# Patient Record
Sex: Female | Born: 1945 | Race: White | Hispanic: No | Smoking: Former smoker
Health system: Southern US, Community
[De-identification: ages and names within clinical notes are randomized; demographics above are authoritative.]

## PROBLEM LIST (undated history)

## (undated) DIAGNOSIS — E785 Hyperlipidemia, unspecified: Secondary | ICD-10-CM

## (undated) DIAGNOSIS — I2510 Atherosclerotic heart disease of native coronary artery without angina pectoris: Secondary | ICD-10-CM

## (undated) DIAGNOSIS — E079 Disorder of thyroid, unspecified: Secondary | ICD-10-CM

## (undated) DIAGNOSIS — F0390 Unspecified dementia without behavioral disturbance: Secondary | ICD-10-CM

## (undated) DIAGNOSIS — F419 Anxiety disorder, unspecified: Secondary | ICD-10-CM

## (undated) DIAGNOSIS — R4189 Other symptoms and signs involving cognitive functions and awareness: Secondary | ICD-10-CM

## (undated) DIAGNOSIS — I10 Essential (primary) hypertension: Secondary | ICD-10-CM

---

## 2017-06-22 ENCOUNTER — Emergency Department
Admission: EM | Admit: 2017-06-22 | Discharge: 2017-06-22 | Disposition: A | Discharge status: Home / Self Care | Payer: Medicare Other | Attending: Student in an Organized Health Care Education/Training Program | Admitting: Student in an Organized Health Care Education/Training Program

## 2017-06-22 ENCOUNTER — Emergency Department: Payer: Medicare Other

## 2017-06-22 ENCOUNTER — Encounter: Payer: Self-pay | Admitting: Emergency Medicine

## 2017-06-22 DIAGNOSIS — I10 Essential (primary) hypertension: Secondary | ICD-10-CM | POA: Insufficient documentation

## 2017-06-22 DIAGNOSIS — F0390 Unspecified dementia without behavioral disturbance: Secondary | ICD-10-CM | POA: Insufficient documentation

## 2017-06-22 DIAGNOSIS — F419 Anxiety disorder, unspecified: Secondary | ICD-10-CM | POA: Insufficient documentation

## 2017-06-22 DIAGNOSIS — Z87891 Personal history of nicotine dependence: Secondary | ICD-10-CM | POA: Insufficient documentation

## 2017-06-22 DIAGNOSIS — K5900 Constipation, unspecified: Secondary | ICD-10-CM | POA: Insufficient documentation

## 2017-06-22 HISTORY — DX: Disorder of thyroid, unspecified: E07.9

## 2017-06-22 HISTORY — DX: Essential (primary) hypertension: I10

## 2017-06-22 HISTORY — DX: Unspecified dementia, unspecified severity, without behavioral disturbance, psychotic disturbance, mood disturbance, and anxiety: F03.90

## 2017-06-22 HISTORY — DX: Anxiety disorder, unspecified: F41.9

## 2017-06-22 MED ORDER — MAGNESIUM CITRATE PO SOLN
1.0000 | Freq: Once | ORAL | Status: AC
  Administered 2017-06-22: 1 via ORAL
  Filled 2017-06-22: qty 296

## 2017-06-22 MED ORDER — POLYETHYLENE GLYCOL 3350 17 G PO PACK
17.0000 g | PACK | Freq: Every day | ORAL | Status: DC
  Administered 2017-06-22: 17 g via ORAL
  Filled 2017-06-22: qty 1

## 2017-06-22 MED ORDER — GLYCERIN (LAXATIVE) 1.2 G RE SUPP
2.0000 | Freq: Once | RECTAL | Status: AC
  Administered 2017-06-22: 2.4 g via RECTAL
  Filled 2017-06-22: qty 2

## 2017-06-22 MED ORDER — PEG 3350 17 GM/SCOOP PO POWD: 17.0000 g | Freq: Every day | ORAL | 0 refills | Status: AC

## 2017-06-22 NOTE — Discharge Instructions (Signed)
Your exam and x-ray were negative for any signs of bowel obstruction or fecal impaction. You should take Miralax daily for stool softening. Consider other high fiber foods for daily fiber intake. Use the glycerin suppository as directed. You may drink half or all of the magnesium citrate in the morning. See your provider for continued symptoms. Return to the ED as needed. Follow-up with Dr. Clovis RileyMitchell for your repeat pelvic ultrasound as we discussed.

## 2017-06-22 NOTE — ED Provider Notes (Signed)
[facility] Emergency Department Provider Note ____________________________________________  Time seen: 1545  I have reviewed the triage vital signs and the nursing notes.  HISTORY  Chief Complaint  Constipation  HPI Julie Gaines is a 72 y.o. female presents to the ED accompanied by an adult friend, from her residence at the [facility].  The patient presents with a complaint of slow bowels and intermittent constipation since she had a hospitalization back in January.  The patient has some concerns about being impacted.  She describes a small amount of firm hard stool that she passed yesterday after eating prunes.  She describes occasional discomfort but she denies any significant pain only pressure.  She does take daily Colace at her assisted living facility.  She does admit that she has decreased intake of water because it is difficult for her to move swiftly to the restroom after excessive water intake.  She denies any rectal pain, bleeding, or hemorrhoids.  She also denies any nausea, vomiting, or abdominal pain.  Past Medical History:  Diagnosis Date  . Anxiety   . Dementia   . Hypertension   . Thyroid disease     There are no active problems to display for this patient.   History reviewed. No pertinent surgical history.  Prior to Admission medications   Medication Sig Start Date End Date Taking? Authorizing Provider  Polyethylene Glycol 3350 (PEG 3350) POWD Take 17 g by mouth daily. 06/22/17 07/22/17  Aislee Landgren, Julie IvoryJenise V Bacon, PA-C    Allergies Zoloft [sertraline hcl]  No family history on file.  Social History Social History   Tobacco Use  . Smoking status: Former Games developermoker  . Smokeless tobacco: Never Used  Substance Use Topics  . Alcohol use: No    Frequency: Never  . Drug use: Not on file    Review of Systems  Constitutional: Negative for fever. Cardiovascular: Negative for chest pain. Respiratory: Negative for shortness of  breath. Gastrointestinal: Negative for abdominal pain, vomiting and diarrhea. Constipation as noted above.  Genitourinary: Negative for dysuria. Musculoskeletal: Negative for back pain. Skin: Negative for rash. Neurological: Negative for headaches, focal weakness or numbness. ____________________________________________  PHYSICAL EXAM:  VITAL SIGNS: ED Triage Vitals  Enc Vitals Group     BP 06/22/17 1521 138/68     Pulse Rate 06/22/17 1521 77     Resp 06/22/17 1521 16     Temp 06/22/17 1521 97.8 F (36.6 C)     Temp Source 06/22/17 1521 Oral     SpO2 06/22/17 1521 97 %     Weight 06/22/17 1522 150 lb (68 kg)     Height 06/22/17 1522 5\' 6"  (1.676 m)     Head Circumference --      Peak Flow --      Pain Score 06/22/17 1521 6     Pain Loc --      Pain Edu? --      Excl. in GC? --     Constitutional: Alert and oriented. Well appearing and in no distress. Head: Normocephalic and atraumatic. Eyes: Conjunctivae are normal. Normal extraocular movements Cardiovascular: Normal rate, regular rhythm. Normal distal pulses. Respiratory: Normal respiratory effort. No wheezes/rales/rhonchi. Gastrointestinal: Soft and nontender. No distention, rebound, guarding, or rigidity. Normoactive bowel sounds x 4.  Musculoskeletal: Nontender with normal range of motion in all extremities.  Neurologic:  Normal gait without ataxia. Normal speech and language. No gross focal neurologic deficits are appreciated. Skin:  Skin is warm, dry and intact. No rash  noted. Psychiatric: Mood is anxand affect are normal. Patient exhibits appropriate insight and judgment. ____________________________________________   RADIOLOGY ABD 1 View  IMPRESSION: Negative. ____________________________________________  PROCEDURES  Procedures ____________________________________________  INITIAL IMPRESSION / ASSESSMENT AND PLAN / ED COURSE  Geriatric patient with ED evaluation of intermittent chronic constipation.   Patient presents with abdominal bloating and pressure.  She had concerns for impaction.  Her exam is overall benign showing soft abdomen with normoactive bowel sounds.  Her x-ray did not show any indication of small bowel obstruction, impaction, or large stool burden.  The patient and her adult friend are reassured by the overall exam and x-ray findings.  Patient has declined taking any of the medications ordered while in the ED to help effect stools.  She has been given the option to take medications when she returns home.  She will follow-up with her primary provider in 2 weeks as suggested.  She is advised to consider daily MiraLAX and increase her high-fiber foods.  Her symptoms likely represent a married of effects on her bowel emptying including her new environment in the assisted living facility.  Some concerns over food choices, some sensitivity over sharing a bathroom facility with a roommate.  Patient overall was pleasant and thankful for all attempts to help her.  She is pleasantly surprised that she is not in fact impacted.  She also is advised to follow-up with her primary provider for a routine outpatient pelvic ultrasound following a CT abdomen and pelvis from November that revealed some (benign) ovarian masses bilaterally.  Return precautions have been reviewed with the patient and her adult friend. ____________________________________________  FINAL CLINICAL IMPRESSION(S) / ED DIAGNOSES  Final diagnoses:  Constipation, unspecified constipation type      Lissa Hoard, PA-C 06/23/17 0008    Willy Eddy, MD 06/25/17 1930

## 2017-06-22 NOTE — ED Notes (Signed)
Pt given medications and she is planning to take them home and drink fluids and use suppositories because she does not want to have a bowel movement tonight here or have an emergency on the way home.

## 2017-06-22 NOTE — ED Triage Notes (Signed)
Patient presents to the ED with complaint of constipation since hospitalization since January.  Patient states, "I'm worried about being impacted."  Patient reports a small bowel movement yesterday after eating prunes.  Patient reports occasional discomfort but states, "It's not really pain, it's pressure."  Patient lives at [facility] assisted living and takes colace daily.

## 2017-09-14 ENCOUNTER — Encounter: Payer: Self-pay | Admitting: Emergency Medicine

## 2017-09-14 ENCOUNTER — Emergency Department: Payer: Medicare Other

## 2017-09-14 ENCOUNTER — Emergency Department
Admission: EM | Admit: 2017-09-14 | Discharge: 2017-09-14 | Disposition: A | Discharge status: Home / Self Care | Payer: Medicare Other | Source: Home / Self Care | Attending: Emergency Medicine | Admitting: Emergency Medicine

## 2017-09-14 ENCOUNTER — Other Ambulatory Visit: Payer: Self-pay

## 2017-09-14 ENCOUNTER — Emergency Department
Admission: EM | Admit: 2017-09-14 | Discharge: 2017-09-14 | Disposition: A | Discharge status: Home / Self Care | Payer: Medicare Other | Attending: Emergency Medicine | Admitting: Emergency Medicine

## 2017-09-14 DIAGNOSIS — I10 Essential (primary) hypertension: Secondary | ICD-10-CM | POA: Insufficient documentation

## 2017-09-14 DIAGNOSIS — Z87891 Personal history of nicotine dependence: Secondary | ICD-10-CM | POA: Insufficient documentation

## 2017-09-14 DIAGNOSIS — M7581 Other shoulder lesions, right shoulder: Secondary | ICD-10-CM | POA: Diagnosis not present

## 2017-09-14 DIAGNOSIS — F0390 Unspecified dementia without behavioral disturbance: Secondary | ICD-10-CM

## 2017-09-14 DIAGNOSIS — M25511 Pain in right shoulder: Secondary | ICD-10-CM | POA: Diagnosis present

## 2017-09-14 MED ORDER — DICLOFENAC SODIUM 1 % TD GEL: 2.0000 g | Freq: Four times a day (QID) | TRANSDERMAL | 1 refills | Status: AC

## 2017-09-14 NOTE — ED Provider Notes (Signed)
[facility] Emergency Department Provider Note ______________________________________   I have reviewed the triage vital signs and the nursing notes.   HISTORY  Chief Complaint Hypertension   History limited by: dementia, some history obtained from daughter   HPI Julie Gaines is a 72 y.o. female who presents to the emergency department today because of concern for blood pressure issues. Daughter states that for the past few weeks they have been having a hard time controlling the patient's blood pressure. They have been in contact with her primary care doctor who has been adjusting and adding medications. Patient was seen in the emergency department earlier today for shoulder injury. However after returning to her living facility she was noted to have high blood pressure. The patient was given her blood pressure medications and when it was checked 40 minutes later it was still high (systolic 170s, diastolic 120s). At this point nursing staff recommended ER evaluation. Patient denies any headache or chest pain.   Per medical record review patient has a history of dementia, ER visit earlier today.  Past Medical History:  Diagnosis Date  . Anxiety   . Dementia   . Hypertension   . Thyroid disease     There are no active problems to display for this patient.   History reviewed. No pertinent surgical history.  Prior to Admission medications   Medication Sig Start Date End Date Taking? Authorizing Provider  diclofenac sodium (VOLTAREN) 1 % GEL Apply 2 g topically 4 (four) times daily for 10 days. 09/14/17 09/24/17  Orvil FeilWoods, Jaclyn M, PA-C    Allergies Zoloft [sertraline hcl]  No family history on file.  Social History Social History   Tobacco Use  . Smoking status: Former Games developermoker  . Smokeless tobacco: Never Used  Substance Use Topics  . Alcohol use: No    Frequency: Never  . Drug use: Not on file    Review of Systems Constitutional: No  fever/chills Eyes: No visual changes. ENT: No sore throat. Cardiovascular: Denies chest pain. Respiratory: Denies shortness of breath. Gastrointestinal: No abdominal pain.  No nausea, no vomiting.  No diarrhea.   Genitourinary: Negative for dysuria. Musculoskeletal: Positive for right shoulder pain. Skin: Negative for rash. Neurological: Negative for headaches, focal weakness or numbness.  ____________________________________________   PHYSICAL EXAM:  VITAL SIGNS: ED Triage Vitals [09/14/17 2142]  Enc Vitals Group     BP (!) 155/67     Pulse Rate (!) 58     Resp 18     Temp 98.2 F (36.8 C)     Temp src      SpO2 100 %     Weight 149 lb (67.6 kg)     Height 5\' 6"  (1.676 m)     Head Circumference      Peak Flow      Pain Score 0   Constitutional: Awake and alert. No acute distress.  Eyes: Conjunctivae are normal.  ENT      Head: Normocephalic and atraumatic.      Nose: No congestion/rhinnorhea.      Mouth/Throat: Mucous membranes are moist.      Neck: No stridor. Hematological/Lymphatic/Immunilogical: No cervical lymphadenopathy. Cardiovascular: Normal rate, regular rhythm.  No murmurs, rubs, or gallops. Respiratory: Normal respiratory effort without tachypnea nor retractions. Breath sounds are clear and equal bilaterally. No wheezes/rales/rhonchi. Gastrointestinal: Soft and non tender. No rebound. No guarding.  Genitourinary: Deferred Musculoskeletal: Normal range of motion in all extremities. No lower extremity edema. Neurologic:  Normal speech and  language. No gross focal neurologic deficits are appreciated.  Skin:  Skin is warm, dry and intact. No rash noted. Psychiatric: Mood and affect are normal. Speech and behavior are normal. Patient exhibits appropriate insight and judgment.  ____________________________________________    LABS (pertinent positives/negatives)  None  ____________________________________________   EKG  I, Phineas Semen, attending  physician, personally viewed and interpreted this EKG  EKG Time: 2216 Rate: 52 Rhythm: sinus bradycardia Axis: normal Intervals: qtc 414 QRS: incomplete RBBB ST changes: no st elevation Impression: abnormal ekg  ____________________________________________    RADIOLOGY  None  ____________________________________________   PROCEDURES  Procedures  ____________________________________________   INITIAL IMPRESSION / ASSESSMENT AND PLAN / ED COURSE  Pertinent labs & imaging results that were available during my care of the patient were reviewed by me and considered in my medical decision making (see chart for details).   Patient presented to the emergency department today because of concerns for high blood pressure.  Otherwise asymptomatic.  She has been working with her primary care doctor.  I did discussion with patient and family.  At this point given lack of headache or chest pain do not feel any imaging or blood work is necessary.  Patient's family was concerned that she might be back in A. fib so EKG was performed.  This showed a sinus rhythm.  Did discuss continued follow-up with primary care doctor for further blood pressure control.   ____________________________________________   FINAL CLINICAL IMPRESSION(S) / ED DIAGNOSES  Final diagnoses:  Hypertension, unspecified type     Note: This dictation was prepared with Dragon dictation. Any transcriptional errors that result from this process are unintentional     Phineas Semen, MD 09/14/17 2218

## 2017-09-14 NOTE — Discharge Instructions (Signed)
Please seek medical attention for any high fevers, chest pain, shortness of breath, change in behavior, persistent vomiting, bloody stool or any other new or concerning symptoms.  

## 2017-09-14 NOTE — ED Triage Notes (Signed)
Patient ambulatory to triage with steady gait, without difficulty or distress noted, with aid of rolling walker; pt reports elevated BP; st seen earlier; st BP 175/120; pt denies c/o

## 2017-09-14 NOTE — ED Triage Notes (Signed)
Pt POA is with her, she reports that she is in an assisted living and two days ago she was getting physical therapy and they thought that she may have a dislocated right shoulder. POA reports that she has frequent falls. She also reports that her B/P has been fluctuating. Pt was able to push her walking wheelchair to the triage room without difficulty. No deformity or dislocations seen at this time.

## 2017-09-14 NOTE — ED Provider Notes (Signed)
[facility] Emergency Department Provider Note  ____________________________________________  Time seen: Approximately 6:32 PM  I have reviewed the triage vital signs and the nursing notes.   HISTORY  Chief Complaint Shoulder Pain    HPI Julie Gaines is a [age] y.o. female presents to the emergency department with 7 out of 10 right shoulder pain exacerbated by overhead activities.  Patient reports that she fell in January of this year and her PCP recommended physical therapy for her shoulder pain.  During physical therapy, patient has been doing overhead activities and patient reports worsening pain.  She denies chest pain or chest tightness.  No radiculopathy of the right arm.  No weakness or changes in sensation.  Patient relates that pain is starting along the deltoid and radiating into the proximal humerus.   Past Medical History:  Diagnosis Date  . Anxiety   . Dementia   . Hypertension   . Thyroid disease     There are no active problems to display for this patient.   History reviewed. No pertinent surgical history.  Prior to Admission medications   Medication Sig Start Date End Date Taking? Authorizing Provider  diclofenac sodium (VOLTAREN) 1 % GEL Apply 2 g topically 4 (four) times daily for 10 days. 09/14/17 09/24/17  Orvil FeilWoods, Karlea Mckibbin M, PA-C    Allergies Zoloft [sertraline hcl]  History reviewed. No pertinent family history.  Social History Social History   Tobacco Use  . Smoking status: Former Games developermoker  . Smokeless tobacco: Never Used  Substance Use Topics  . Alcohol use: No    Frequency: Never  . Drug use: Not on file     Review of Systems  Constitutional: No fever/chills Eyes: No visual changes. No discharge ENT: No upper respiratory complaints. Cardiovascular: no chest pain. Respiratory: no cough. No SOB. Gastrointestinal: No abdominal pain.  No nausea, no vomiting.  No diarrhea.  No constipation. Musculoskeletal: Patient has right  shoulder pain.  Skin: Negative for rash, abrasions, lacerations, ecchymosis. Neurological: Negative for headaches, focal weakness or numbness.  ____________________________________________   PHYSICAL EXAM:  VITAL SIGNS: ED Triage Vitals  Enc Vitals Group     BP 09/14/17 1527 (!) 155/79     Pulse Rate 09/14/17 1527 78     Resp 09/14/17 1527 18     Temp 09/14/17 1527 98.1 F (36.7 C)     Temp Source 09/14/17 1527 Oral     SpO2 09/14/17 1527 99 %     Weight 09/14/17 1528 149 lb (67.6 kg)     Height 09/14/17 1528 5\' 6"  (1.676 m)     Head Circumference --      Peak Flow --      Pain Score 09/14/17 1527 3     Pain Loc --      Pain Edu? --      Excl. in GC? --      Constitutional: Alert and oriented. Well appearing and in no acute distress. Eyes: Conjunctivae are normal. PERRL. EOMI. Head: Atraumatic. Cardiovascular: Normal rate, regular rhythm. Normal S1 and S2.  Good peripheral circulation. Respiratory: Normal respiratory effort without tachypnea or retractions. Lungs CTAB. Good air entry to the bases with no decreased or absent breath sounds. Gastrointestinal: Bowel sounds 4 quadrants. Soft and nontender to palpation. No guarding or rigidity. No palpable masses. No distention. No CVA tenderness. Musculoskeletal: Full range of motion to all extremities. No gross deformities appreciated.  No weakness with right rotator cuff testing.  Patient had positive Neer sign.  No tenderness elicited with palpation over the right AC joint. Neurologic:  Normal speech and language. No gross focal neurologic deficits are appreciated.  Skin:  Skin is warm, dry and intact. No rash noted.   ____________________________________________   LABS (all labs ordered are listed, but only abnormal results are displayed)  Labs Reviewed - No data to display ____________________________________________  EKG   ____________________________________________  RADIOLOGY Geraldo Pitter, personally  viewed and evaluated these images (plain radiographs) as part of my medical decision making, as well as reviewing the written report by the radiologist.  Dg Shoulder Right  Result Date: 09/14/2017 CLINICAL DATA:  72 year old female with right shoulder pain, possible shoulder dislocation 2 days ago. EXAM: RIGHT SHOULDER - 2+ VIEW COMPARISON:  None available. FINDINGS: No glenohumeral joint dislocation. The right clavicle, scapula, and proximal right humerus appear intact. It is unclear whether there may be right AC joint separation (image 2). Negative visible right ribs and lung parenchyma. IMPRESSION: Negative for fracture or right glenohumeral dislocation. If there is right AC joint region pain then consider bilateral clavicle/acromioclavicular views to exclude AC separation. Electronically Signed   By: Odessa Fleming M.D.   On: 09/14/2017 16:43    ____________________________________________    PROCEDURES  Procedure(s) performed:    Procedures    Medications - No data to display   ____________________________________________   INITIAL IMPRESSION / ASSESSMENT AND PLAN / ED COURSE  Pertinent labs & imaging results that were available during my care of the patient were reviewed by me and considered in my medical decision making (see chart for details).  Review of the [place] CSRS was performed in accordance of the [facility] prior to dispensing any controlled drugs.     Assessment and Plan:  Rotator cuff tendinitis Patient presents to the emergency department with right shoulder pain exacerbated by overhead activities.  History and physical exam findings were consistent with right rotator cuff tendinitis.  Patient was discharged with Voltaren gel as she cannot tolerate systemic anti-inflammatories and is resistant to systemic steroids.  Patient was referred to orthopedics.  All patient questions were answered.     ____________________________________________  FINAL CLINICAL IMPRESSION(S) / ED  DIAGNOSES  Final diagnoses:  Tendinitis of right rotator cuff      NEW MEDICATIONS STARTED DURING THIS VISIT:  ED Discharge Orders        Ordered    diclofenac sodium (VOLTAREN) 1 % GEL  4 times daily     09/14/17 1746          This chart was dictated using voice recognition software/Dragon. Despite best efforts to proofread, errors can occur which can change the meaning. Any change was purely unintentional.    Orvil Feil, PA-C 09/14/17 1840    Sharyn Creamer, MD 09/14/17 [phone]

## 2018-01-19 ENCOUNTER — Other Ambulatory Visit: Payer: Self-pay

## 2018-01-19 ENCOUNTER — Emergency Department
Admission: EM | Admit: 2018-01-19 | Discharge: 2018-01-21 | Disposition: A | Discharge status: Home / Self Care | Payer: Medicare Other | Attending: Emergency Medicine | Admitting: Emergency Medicine

## 2018-01-19 ENCOUNTER — Encounter: Payer: Self-pay | Admitting: Emergency Medicine

## 2018-01-19 ENCOUNTER — Emergency Department: Payer: Medicare Other

## 2018-01-19 DIAGNOSIS — Z87891 Personal history of nicotine dependence: Secondary | ICD-10-CM | POA: Insufficient documentation

## 2018-01-19 DIAGNOSIS — I10 Essential (primary) hypertension: Secondary | ICD-10-CM | POA: Insufficient documentation

## 2018-01-19 DIAGNOSIS — N3090 Cystitis, unspecified without hematuria: Secondary | ICD-10-CM | POA: Insufficient documentation

## 2018-01-19 DIAGNOSIS — F419 Anxiety disorder, unspecified: Secondary | ICD-10-CM | POA: Insufficient documentation

## 2018-01-19 DIAGNOSIS — F0390 Unspecified dementia without behavioral disturbance: Secondary | ICD-10-CM | POA: Insufficient documentation

## 2018-01-19 DIAGNOSIS — R413 Other amnesia: Secondary | ICD-10-CM

## 2018-01-19 DIAGNOSIS — R2681 Unsteadiness on feet: Secondary | ICD-10-CM | POA: Insufficient documentation

## 2018-01-19 DIAGNOSIS — N3000 Acute cystitis without hematuria: Secondary | ICD-10-CM

## 2018-01-19 DIAGNOSIS — R4182 Altered mental status, unspecified: Secondary | ICD-10-CM | POA: Diagnosis present

## 2018-01-19 LAB — URINALYSIS, COMPLETE (UACMP) WITH MICROSCOPIC
Bacteria, UA: NONE SEEN
Bilirubin Urine: NEGATIVE
GLUCOSE, UA: NEGATIVE mg/dL
HGB URINE DIPSTICK: NEGATIVE
Ketones, ur: NEGATIVE mg/dL
NITRITE: NEGATIVE
PH: 6 (ref 5.0–8.0)
Protein, ur: NEGATIVE mg/dL
SPECIFIC GRAVITY, URINE: 1.024 (ref 1.005–1.030)

## 2018-01-19 LAB — COMPREHENSIVE METABOLIC PANEL
ALT: 23 U/L (ref 0–44)
ANION GAP: 7 (ref 5–15)
AST: 25 U/L (ref 15–41)
Albumin: 3.7 g/dL (ref 3.5–5.0)
Alkaline Phosphatase: 120 U/L (ref 38–126)
BILIRUBIN TOTAL: 0.5 mg/dL (ref 0.3–1.2)
BUN: 29 mg/dL — AB (ref 8–23)
CO2: 25 mmol/L (ref 22–32)
Calcium: 8.9 mg/dL (ref 8.9–10.3)
Chloride: 110 mmol/L (ref 98–111)
Creatinine, Ser: 1.3 mg/dL — ABNORMAL HIGH (ref 0.44–1.00)
GFR calc Af Amer: 46 mL/min — ABNORMAL LOW (ref >60)
GFR, EST NON AFRICAN AMERICAN: 40 mL/min — AB (ref >60)
Glucose, Bld: 104 mg/dL — ABNORMAL HIGH (ref 70–99)
POTASSIUM: 4.1 mmol/L (ref 3.5–5.1)
Sodium: 142 mmol/L (ref 135–145)
TOTAL PROTEIN: 6.6 g/dL (ref 6.5–8.1)

## 2018-01-19 LAB — CBC
HCT: 38.7 % (ref 36.0–46.0)
HEMOGLOBIN: 12.9 g/dL (ref 12.0–15.0)
MCH: 31.4 pg (ref 26.0–34.0)
MCHC: 33.3 g/dL (ref 30.0–36.0)
MCV: 94.2 fL (ref 80.0–100.0)
Platelets: 224 10*3/uL (ref 150–400)
RBC: 4.11 MIL/uL (ref 3.87–5.11)
RDW: 12.9 % (ref 11.5–15.5)
WBC: 7.8 10*3/uL (ref 4.0–10.5)
nRBC: 0 % (ref 0.0–0.2)

## 2018-01-19 MED ORDER — CEPHALEXIN 500 MG PO CAPS
500.0000 mg | ORAL_CAPSULE | Freq: Once | ORAL | Status: AC
  Administered 2018-01-19: 500 mg via ORAL
  Filled 2018-01-19: qty 1

## 2018-01-19 MED ORDER — CEPHALEXIN 500 MG PO CAPS: 500.0000 mg | ORAL_CAPSULE | Freq: Four times a day (QID) | ORAL | 0 refills | Status: AC

## 2018-01-19 NOTE — Progress Notes (Signed)
   [facility]  Impression: encephalopthy superimposed on history of vascular dementia. Mentation is at baseline on exam, she is AAOx3. This is likely in setting of concurrent possible UTI. There is also a question of NPH on CT brain. This would not cause acute change in mentation but may be causing some of the long standing gait unsteadiness and dementia. Will need workup as an outpatient with MRI, will need large volume LP and neurosurgery evaluation.   Recommendations:  Antibiotics per ED attending  Neurosurgery evaluation for NPH Outpatient neurology fu as well  ---------------------------------------------------------------------  CC: worsening mentation and gait imbalance.   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 She has been unwell and difficulty with her feet and coordination. She states she has "fluid in her brain" and she has this sensation of losing control of her body. This has been going on 1-2 years where she feels progressively worse. Her POA came to visit her today and she was concerned she was not doing well. She uses a rolling walker at baseline and today she was more unsteady. She lives in a memory care unit and at the facility they noted her to be more forgetful over the past 3 days. Today she was shaky and clammy. She has a UTI and cultures pending. No new speech or visual changes. She does report incontinence as well.  She does not recall when her last stroke was, on asa only.   Diagnostic Testing: CT head: No acute intracranial abnormality.  Disproportionate ventricular enlargement to the degree of cortical atrophy. Extensive hypoattenuation of white matter could represent transependymal penetration of cerebrospinal fluid.  Correlate clinically for normal pressure hydrocephalus as a  cause of dementia.  Vital Signs:  reviewed  Exam:  Mental Status:  Awake, alert, oriented to self, place, date January 17, 2018 Naming: Intact Repetition: Intact   Speech: fluent  Cranial Nerves:  Pupils: Equal round and reactive to light Extraocular movements: Intact in all cardinal gaze Ptosis: Absent Visual fields: Intact to finger counting Facial sensation: Intact to pin and light touch Facial movements: Intact and symmetric    Motor Exam:  No drift   Tremor/Abnormal Movements:  Resting tremor: Absent Intention tremor: Absent Postural tremor: Absent  Sensory Exam:   Light touch: Intact Pinprick: Intact    Coordination:   Finger to nose: Intact Heel to shin: Intact  Medical Decision Making:  - Extensive number of diagnosis or management options are considered above.   - Extensive amount of complex data reviewed.   - High risk of complication and/or morbidity or mortality are associated with differential diagnostic considerations above.  - There may be uncertain outcome and increased probability of prolonged functional impairment or high probability of severe prolonged functional impairment associated with some of these differential diagnosis.   Medical Data Reviewed:  1.Data reviewed include clinical labs, radiology,  Medical Tests;   2.Tests results discussed w/performing or interpreting physician;   3.Obtaining/reviewing old medical records;  4.Obtaining case history from another source;  5.Independent review of image, tracing or specimen.    Patient was informed the Neurology Consult would happen via telehealth (remote video) and consented to receiving care in this manner.

## 2018-01-19 NOTE — ED Notes (Signed)
Pts daughter states pt has been having increased int. Confusion, Pt oriented to person place time situation. Pt resides in memory care assisted living. Pt assisted to toilet.

## 2018-01-19 NOTE — Discharge Instructions (Addendum)
Please make an appointment with your primary care physician to be reevaluated for your urinary tract infection, have your urine culture followed up, and discuss your anxiety and possibly restarting an SSRI.  Please take the entire course of antibiotics, even if you are feeling better.  Please make an appointment with a neurologist, to be evaluated for a condition called normal pressure hydrocephalus.  Return to the emergency department if you develop changes in mental status, falls, numbness tingling or weakness, visual or speech changes, fever or any other symptoms concerning to you.

## 2018-01-19 NOTE — ED Provider Notes (Signed)
[facility] Emergency Department Provider Note  ____________________________________________  Time seen: Approximately 5:35 PM  I have reviewed the triage vital signs and the nursing notes.   HISTORY  Chief Complaint Shoulder Pain and Altered Mental Status    HPI Julie Gaines is a 72 y.o. female a history of vascular dementia, HTN, anxiety, presenting for changes in mental status.  The patient is accompanied by her friend, who is her POA.  Per report, the patient has felt "off" for the last 3 days.  Her friend has talked to her on the phone, and she has asked questions about testing that she has had done in the past and already knows the results for.  She is also been feeling unsteady on her feet but has not had any falls.  She has had not had any changes in her medications and denies any illness including nausea vomiting or diarrhea, changes in appetite, dysuria.  She states she does not drink much water because "it goes right through me."  She reports ongoing right shoulder pain, which she has had chronically due to an [facility] joint dislocation without any new symptoms.  Patient also reports a long history of anxiety, for which she was on SSRIs in the past for approximately 10 to 15 years.  Recently, her anxiety has worsened and she feels particularly anxious in large crowds, which there often are in her facility.  Her PCP has recommended restarting SSRIs and she has been hesitant to do so.  Past Medical History:  Diagnosis Date  . Anxiety   . Dementia (HCC)   . Hypertension   . Thyroid disease     There are no active problems to display for this patient.   History reviewed. No pertinent surgical history.    Allergies Zoloft [sertraline hcl]  No family history on file.  Social History Social History   Tobacco Use  . Smoking status: Former Games developer  . Smokeless tobacco: Never Used  Substance Use Topics  . Alcohol use: No    Frequency: Never  . Drug use:  Not on file    Review of Systems Constitutional: No fever/chills.  No lightheadedness or syncope.  No trauma. Eyes: No visual changes.  No blurred or double vision. ENT: No sore throat. No congestion or rhinorrhea. Cardiovascular: Denies chest pain. Denies palpitations. Respiratory: Denies shortness of breath.  No cough. Gastrointestinal: No abdominal pain.  No nausea, no vomiting.  No diarrhea.  No constipation.  Decreased liquid p.o. intake. Genitourinary: Negative for dysuria.  Positive urinary frequency. Musculoskeletal: Negative for back pain.  Chronic right shoulder pain which is unchanged. Skin: Negative for rash. Neurological: Negative for headaches. No focal numbness, tingling or weakness.  Gait instability.  Changes in long-term memory. Psychiatric:Positive anxiety.  ____________________________________________   PHYSICAL EXAM:  VITAL SIGNS: ED Triage Vitals  Enc Vitals Group     BP 01/19/18 1601 (!) 168/100     Pulse Rate 01/19/18 1601 61     Resp 01/19/18 1601 16     Temp 01/19/18 1601 98.2 F (36.8 C)     Temp Source 01/19/18 1601 Oral     SpO2 01/19/18 1601 96 %     Weight 01/19/18 1602 154 lb (69.9 kg)     Height 01/19/18 1602 5\' 7"  (1.702 m)     Head Circumference --      Peak Flow --      Pain Score 01/19/18 1601 0     Pain Loc --  Pain Edu? --      Excl. in GC? --     Constitutional: Alert and answers most questions appropriately although sometimes makes abnormal connections that are nonsensical.  GCS is 15.   Eyes: Conjunctivae are normal.  EOMI. PERRLA.  No scleral icterus. Head: Atraumatic. Nose: No congestion/rhinnorhea. Mouth/Throat: Mucous membranes are moist.  Neck: No stridor.  Supple.  No JVD.  No meningismus. Cardiovascular: Normal rate, regular rhythm. No murmurs, rubs or gallops.  Respiratory: Normal respiratory effort.  No accessory muscle use or retractions. Lungs CTAB.  No wheezes, rales or ronchi. Gastrointestinal: Soft,  nontender and nondistended.  No guarding or rebound.  No peritoneal signs. Musculoskeletal: No LE edema.  Neurologic: Alert.  Speech is clear.  Face and smile are symmetric.  EOMI.  Moves all extremities well.  Gait is reassuring without ataxia. Skin:  Skin is warm, dry and intact. No rash noted. Psychiatric: The patient has a normal mood with a mildly anxious affect.  ____________________________________________   LABS (all labs ordered are listed, but only abnormal results are displayed)  Labs Reviewed  COMPREHENSIVE METABOLIC PANEL - Abnormal; Notable for the following components:      Result Value   Glucose, Bld 104 (*)    BUN 29 (*)    Creatinine, Ser 1.30 (*)    GFR calc non Af Amer 40 (*)    GFR calc Af Amer 46 (*)    All other components within normal limits  URINALYSIS, COMPLETE (UACMP) WITH MICROSCOPIC - Abnormal; Notable for the following components:   Color, Urine AMBER (*)    APPearance HAZY (*)    Leukocytes, UA TRACE (*)    All other components within normal limits  URINE CULTURE  CBC  CBG MONITORING, ED   ____________________________________________  EKG  ED ECG REPORT I, Anne-Caroline Sharma Covert, the attending physician, personally viewed and interpreted this ECG.   Date: 01/19/2018  EKG Time: 1741  Rate: 67  Rhythm: normal sinus rhythm  Axis: normal  Intervals:none  ST&T Change: no STEMI; unspecific T wave inversions in V1 and V2.  ____________________________________________  RADIOLOGY  Dg Shoulder Right  Result Date: 01/19/2018 CLINICAL DATA:  Pain after fall 9 months ago. Progressive pain over the last couple of weeks. EXAM: RIGHT SHOULDER - 2+ VIEW COMPARISON:  None. FINDINGS: There is no evidence of fracture or dislocation. There is no evidence of arthropathy or other focal bone abnormality. Soft tissues are unremarkable. IMPRESSION: Negative right shoulder radiographs. Electronically Signed   By: Marin Roberts M.D.   On: 01/19/2018 16:51    Ct Head Wo Contrast  Result Date: 01/19/2018 CLINICAL DATA:  Assisted living patient with dementia, altered mental status. EXAM: CT HEAD WITHOUT CONTRAST TECHNIQUE: Contiguous axial images were obtained from the base of the skull through the vertex without intravenous contrast. COMPARISON:  None. FINDINGS: Brain: No visible acute stroke, acute hemorrhage, mass lesion, or extra-axial fluid. Marked ventricular enlargement, disproportionate to the degree of cortical atrophy, suggesting normal pressure hydrocephalus. Extensive hypoattenuation of white matter could represent transependymal absorption of CSF. Vascular: Only minor atheromatous change at the carotid siphons. No signs of large vessel occlusion. Skull: Intact. Sinuses/Orbits: No acute findings. Other: None. IMPRESSION: No acute intracranial abnormality. Disproportionate ventricular enlargement to the degree of cortical atrophy. Extensive hypoattenuation of white matter could represent transependymal penetration of cerebrospinal fluid. Correlate clinically for normal pressure hydrocephalus as a cause of dementia. Electronically Signed   By: Elsie Stain M.D.   On: 01/19/2018 18:17  ____________________________________________   PROCEDURES  Procedure(s) performed: None  Procedures  Critical Care performed: No ____________________________________________   INITIAL IMPRESSION / ASSESSMENT AND PLAN / ED COURSE  Pertinent labs & imaging results that were available during my care of the patient were reviewed by me and considered in my medical decision making (see chart for details).  72 y.o. female with a history of vascular dementia, HTN, presenting with 3 days of being "off," including decreased ability with walking, and some memory impairment.  Overall, patient is hypertensive at 168/100 with otherwise normal vital signs; she is afebrile.  Clinically, she has some memory impairment but no other focal neurologic deficits.  We will  look for other causes of changes in mental status, including UTI.  Is also possible that she has worsening of her baseline vascular dementia.  Plan CT of the head for evaluation of stroke although I see no new focal neurologic deficit.  Plan reevaluation for final disposition.  ----------------------------------------- 8:14 PM on 01/19/2018 -----------------------------------------  The patient continues to be hemodynamically stable in the emergency department.  Her urinalysis shows white blood cells and leukocyte esterase without bacteria; a culture has been sent and the patient will be started on Keflex today.  Her CT scan does not show any acute stroke however there is a read stating that she has disproportionate ventricular enlargement to the degree of cortical atrophy concerning for possible normal pressure hydrocephalus.  Clearly, I would be concerned about this diagnosis given her memory difficulties and difficulty walking due to instability.  A tele-neurology consult has been placed.  ----------------------------------------- 8:59 PM on 01/19/2018 -----------------------------------------  The patient has been seen by neurologist who agrees that normal pressure hydrocephalus, while a possibility, requires outpatient evaluation.  I have given the patient the name of 2 local neurologists for follow-up.  I have talked to the patient about her urinary findings, and she understands that we will start her on Keflex and her primary care physician can follow-up the results of her culture.  In addition, I have encouraged her to talk to her primary care physician about her anxiety symptoms and possibly restarting an SSRI.  At this time, the patient is safe for discharge home.  Return precautions as well as follow-up instructions were discussed  ____________________________________________  FINAL CLINICAL IMPRESSION(S) / ED DIAGNOSES  Final diagnoses:  Unsteady gait  Memory problem  Acute cystitis  without hematuria  Anxiety         NEW MEDICATIONS STARTED DURING THIS VISIT:  New Prescriptions   CEPHALEXIN (KEFLEX) 500 MG CAPSULE    Take 1 capsule (500 mg total) by mouth 4 (four) times daily for 5 days.      Rockne Menghini, MD 01/19/18 2101

## 2018-01-19 NOTE — ED Triage Notes (Signed)
PT to ED with family from [facility]. PT hx of dementia but family states pt is not at baseline. PT states she is " overwhelmed because of all the people at the facility". PT also c/o RT shoulder pain x3days, facility does not know if possible fall. PT not on any anticoags. VSS . PT A&O x3

## 2018-01-21 LAB — URINE CULTURE: Culture: <10000 — AB

## 2018-05-01 ENCOUNTER — Encounter: Payer: Self-pay | Admitting: *Deleted

## 2018-05-01 ENCOUNTER — Inpatient Hospital Stay
Admission: EM | Admit: 2018-05-01 | Discharge: 2018-05-07 | DRG: 689 | Disposition: A | Discharge status: Home Health | Payer: Medicare Other | Attending: Specialist | Admitting: Specialist

## 2018-05-01 ENCOUNTER — Other Ambulatory Visit: Payer: Self-pay

## 2018-05-01 ENCOUNTER — Emergency Department: Payer: Medicare Other

## 2018-05-01 DIAGNOSIS — W1811XA Fall from or off toilet without subsequent striking against object, initial encounter: Secondary | ICD-10-CM | POA: Diagnosis present

## 2018-05-01 DIAGNOSIS — Z7982 Long term (current) use of aspirin: Secondary | ICD-10-CM | POA: Diagnosis not present

## 2018-05-01 DIAGNOSIS — I10 Essential (primary) hypertension: Secondary | ICD-10-CM | POA: Diagnosis present

## 2018-05-01 DIAGNOSIS — F0150 Vascular dementia without behavioral disturbance: Secondary | ICD-10-CM | POA: Diagnosis present

## 2018-05-01 DIAGNOSIS — E785 Hyperlipidemia, unspecified: Secondary | ICD-10-CM | POA: Diagnosis present

## 2018-05-01 DIAGNOSIS — Z87891 Personal history of nicotine dependence: Secondary | ICD-10-CM | POA: Diagnosis not present

## 2018-05-01 DIAGNOSIS — Y92091 Bathroom in other non-institutional residence as the place of occurrence of the external cause: Secondary | ICD-10-CM

## 2018-05-01 DIAGNOSIS — N390 Urinary tract infection, site not specified: Principal | ICD-10-CM | POA: Diagnosis present

## 2018-05-01 DIAGNOSIS — M79672 Pain in left foot: Secondary | ICD-10-CM | POA: Diagnosis present

## 2018-05-01 DIAGNOSIS — Z888 Allergy status to other drugs, medicaments and biological substances status: Secondary | ICD-10-CM

## 2018-05-01 DIAGNOSIS — I2510 Atherosclerotic heart disease of native coronary artery without angina pectoris: Secondary | ICD-10-CM | POA: Diagnosis present

## 2018-05-01 DIAGNOSIS — Z79890 Hormone replacement therapy (postmenopausal): Secondary | ICD-10-CM | POA: Diagnosis not present

## 2018-05-01 DIAGNOSIS — N3001 Acute cystitis with hematuria: Secondary | ICD-10-CM

## 2018-05-01 DIAGNOSIS — F419 Anxiety disorder, unspecified: Secondary | ICD-10-CM | POA: Diagnosis present

## 2018-05-01 DIAGNOSIS — G9341 Metabolic encephalopathy: Secondary | ICD-10-CM | POA: Diagnosis present

## 2018-05-01 DIAGNOSIS — B9620 Unspecified Escherichia coli [E. coli] as the cause of diseases classified elsewhere: Secondary | ICD-10-CM | POA: Diagnosis present

## 2018-05-01 DIAGNOSIS — Z79899 Other long term (current) drug therapy: Secondary | ICD-10-CM | POA: Diagnosis not present

## 2018-05-01 DIAGNOSIS — M1990 Unspecified osteoarthritis, unspecified site: Secondary | ICD-10-CM | POA: Diagnosis present

## 2018-05-01 DIAGNOSIS — R350 Frequency of micturition: Secondary | ICD-10-CM | POA: Diagnosis not present

## 2018-05-01 DIAGNOSIS — E039 Hypothyroidism, unspecified: Secondary | ICD-10-CM | POA: Diagnosis present

## 2018-05-01 DIAGNOSIS — R509 Fever, unspecified: Secondary | ICD-10-CM

## 2018-05-01 DIAGNOSIS — Z66 Do not resuscitate: Secondary | ICD-10-CM | POA: Diagnosis present

## 2018-05-01 HISTORY — DX: Hyperlipidemia, unspecified: E78.5

## 2018-05-01 HISTORY — DX: Atherosclerotic heart disease of native coronary artery without angina pectoris: I25.10

## 2018-05-01 HISTORY — DX: Other symptoms and signs involving cognitive functions and awareness: R41.89

## 2018-05-01 LAB — BASIC METABOLIC PANEL
Anion gap: 7 (ref 5–15)
BUN: 24 mg/dL — AB (ref 8–23)
CO2: 29 mmol/L (ref 22–32)
Calcium: 8.6 mg/dL — ABNORMAL LOW (ref 8.9–10.3)
Chloride: 101 mmol/L (ref 98–111)
Creatinine, Ser: 1.14 mg/dL — ABNORMAL HIGH (ref 0.44–1.00)
GFR calc Af Amer: 56 mL/min — ABNORMAL LOW (ref >60)
GFR calc non Af Amer: 48 mL/min — ABNORMAL LOW (ref >60)
Glucose, Bld: 134 mg/dL — ABNORMAL HIGH (ref 70–99)
Potassium: 3.4 mmol/L — ABNORMAL LOW (ref 3.5–5.1)
Sodium: 137 mmol/L (ref 135–145)

## 2018-05-01 LAB — URINALYSIS, COMPLETE (UACMP) WITH MICROSCOPIC
Bilirubin Urine: NEGATIVE
Glucose, UA: NEGATIVE mg/dL
Ketones, ur: NEGATIVE mg/dL
Nitrite: POSITIVE — AB
Protein, ur: 100 mg/dL — AB
Specific Gravity, Urine: 1.018 (ref 1.005–1.030)
WBC, UA: >50 WBC/hpf — ABNORMAL HIGH (ref 0–5)
pH: 6 (ref 5.0–8.0)

## 2018-05-01 LAB — CBC
HCT: 39.3 % (ref 36.0–46.0)
Hemoglobin: 13.1 g/dL (ref 12.0–15.0)
MCH: 31.5 pg (ref 26.0–34.0)
MCHC: 33.3 g/dL (ref 30.0–36.0)
MCV: 94.5 fL (ref 80.0–100.0)
Platelets: 167 10*3/uL (ref 150–400)
RBC: 4.16 MIL/uL (ref 3.87–5.11)
RDW: 12.7 % (ref 11.5–15.5)
WBC: 13.1 10*3/uL — ABNORMAL HIGH (ref 4.0–10.5)
nRBC: 0 % (ref 0.0–0.2)

## 2018-05-01 LAB — MRSA PCR SCREENING: MRSA by PCR: NEGATIVE

## 2018-05-01 MED ORDER — ONDANSETRON HCL 4 MG/2ML IJ SOLN: 4.0000 mg | Freq: Four times a day (QID) | INTRAMUSCULAR | Status: DC | PRN

## 2018-05-01 MED ORDER — DOCUSATE SODIUM 100 MG PO CAPS
100.0000 mg | ORAL_CAPSULE | Freq: Two times a day (BID) | ORAL | Status: DC | PRN
  Administered 2018-05-02: 100 mg via ORAL
  Filled 2018-05-01: qty 1

## 2018-05-01 MED ORDER — SODIUM CHLORIDE 0.9 % IV SOLN
INTRAVENOUS | Status: DC
  Administered 2018-05-01 – 2018-05-02 (×2): via INTRAVENOUS

## 2018-05-01 MED ORDER — ACETAMINOPHEN 650 MG RE SUPP: 650.0000 mg | Freq: Four times a day (QID) | RECTAL | Status: DC | PRN

## 2018-05-01 MED ORDER — SODIUM CHLORIDE 0.9 % IV SOLN
1.0000 g | INTRAVENOUS | Status: DC
  Administered 2018-05-02 – 2018-05-03 (×2): 1 g via INTRAVENOUS
  Filled 2018-05-01: qty 10
  Filled 2018-05-01: qty 1
  Filled 2018-05-01: qty 10
  Filled 2018-05-01: qty 1

## 2018-05-01 MED ORDER — LEVOTHYROXINE SODIUM 50 MCG PO TABS
50.0000 ug | ORAL_TABLET | Freq: Every day | ORAL | Status: DC
  Administered 2018-05-02 – 2018-05-07 (×5): 50 ug via ORAL
  Filled 2018-05-01 (×6): qty 1

## 2018-05-01 MED ORDER — ACYCLOVIR 200 MG PO CAPS
400.0000 mg | ORAL_CAPSULE | Freq: Two times a day (BID) | ORAL | Status: DC
  Administered 2018-05-01 – 2018-05-07 (×12): 400 mg via ORAL
  Filled 2018-05-01 (×13): qty 2

## 2018-05-01 MED ORDER — ENOXAPARIN SODIUM 40 MG/0.4ML ~~LOC~~ SOLN
40.0000 mg | SUBCUTANEOUS | Status: DC
  Filled 2018-05-01 (×4): qty 0.4

## 2018-05-01 MED ORDER — ONDANSETRON HCL 4 MG PO TABS: 4.0000 mg | ORAL_TABLET | Freq: Four times a day (QID) | ORAL | Status: DC | PRN

## 2018-05-01 MED ORDER — SODIUM CHLORIDE 0.9 % IV SOLN
1.0000 g | Freq: Once | INTRAVENOUS | Status: AC
  Administered 2018-05-01: 1 g via INTRAVENOUS

## 2018-05-01 MED ORDER — IBUPROFEN 400 MG PO TABS
200.0000 mg | ORAL_TABLET | Freq: Four times a day (QID) | ORAL | Status: DC | PRN
  Filled 2018-05-01: qty 1

## 2018-05-01 MED ORDER — ASPIRIN 81 MG PO CHEW
81.0000 mg | CHEWABLE_TABLET | Freq: Every day | ORAL | Status: DC
  Administered 2018-05-02 – 2018-05-07 (×6): 81 mg via ORAL
  Filled 2018-05-01 (×6): qty 1

## 2018-05-01 MED ORDER — TRAZODONE HCL 100 MG PO TABS
100.0000 mg | ORAL_TABLET | Freq: Every day | ORAL | Status: DC
  Administered 2018-05-01 – 2018-05-06 (×6): 100 mg via ORAL
  Filled 2018-05-01 (×6): qty 1

## 2018-05-01 MED ORDER — LISINOPRIL 10 MG PO TABS
10.0000 mg | ORAL_TABLET | Freq: Every day | ORAL | Status: DC
  Administered 2018-05-02 – 2018-05-07 (×6): 10 mg via ORAL
  Filled 2018-05-01 (×6): qty 1

## 2018-05-01 MED ORDER — ACETAMINOPHEN 325 MG PO TABS
650.0000 mg | ORAL_TABLET | Freq: Four times a day (QID) | ORAL | Status: DC | PRN
  Administered 2018-05-01 – 2018-05-07 (×9): 650 mg via ORAL
  Filled 2018-05-01 (×9): qty 2

## 2018-05-01 MED ORDER — LOPERAMIDE HCL 2 MG PO CAPS: 2.0000 mg | ORAL_CAPSULE | Freq: Four times a day (QID) | ORAL | Status: DC | PRN

## 2018-05-01 MED ORDER — METOPROLOL TARTRATE 50 MG PO TABS
50.0000 mg | ORAL_TABLET | Freq: Two times a day (BID) | ORAL | Status: DC
  Administered 2018-05-01 – 2018-05-07 (×12): 50 mg via ORAL
  Filled 2018-05-01 (×12): qty 1

## 2018-05-01 MED ORDER — ALUM & MAG HYDROXIDE-SIMETH 200-200-20 MG/5ML PO SUSP: 30.0000 mL | Freq: Once | ORAL | Status: DC | PRN

## 2018-05-01 MED ORDER — ACETAMINOPHEN 325 MG PO TABS
ORAL_TABLET | ORAL | Status: AC
  Filled 2018-05-01: qty 2

## 2018-05-01 MED ORDER — ALPRAZOLAM 0.5 MG PO TABS
0.5000 mg | ORAL_TABLET | Freq: Every day | ORAL | Status: DC
  Administered 2018-05-01 – 2018-05-07 (×7): 0.5 mg via ORAL
  Filled 2018-05-01 (×7): qty 1

## 2018-05-01 MED ORDER — BUSPIRONE HCL 15 MG PO TABS
7.5000 mg | ORAL_TABLET | Freq: Two times a day (BID) | ORAL | Status: DC
  Administered 2018-05-01 – 2018-05-06 (×11): 7.5 mg via ORAL
  Filled 2018-05-01 (×13): qty 1

## 2018-05-01 MED ORDER — ALPRAZOLAM 0.5 MG PO TABS
1.5000 mg | ORAL_TABLET | Freq: Every day | ORAL | Status: DC
  Administered 2018-05-01 – 2018-05-06 (×6): 1.5 mg via ORAL
  Filled 2018-05-01 (×6): qty 3

## 2018-05-01 MED ORDER — BISMUTH SUBSALICYLATE 262 MG/15ML PO SUSP
15.0000 mL | ORAL | Status: DC | PRN
  Filled 2018-05-01: qty 118

## 2018-05-01 MED ORDER — MAGNESIUM HYDROXIDE 400 MG/5ML PO SUSP
30.0000 mL | Freq: Once | ORAL | Status: DC | PRN
  Filled 2018-05-01: qty 30

## 2018-05-01 NOTE — Progress Notes (Signed)
Advanced care plan.  Purpose of the Encounter: CODE STATUS  Parties in Attendance: Patient herself and her healthcare power of attorney Corky Downs  Patient's Decision Capacity: Not intact but healthcare power of attorney is present  Subjective/Patient's story: Julie Gaines  is a 73 y.o. female with a known history of anxiety, coronary artery disease, cognitive dysfunction due to vascular dementia, hyperlipidemia, hypertension, hypothyroidism who is presenting to the emergency room with burning with urination and pain with urination   Objective/Medical story I discussed with the patient and her healthcare power regarding CODE STATUS   Goals of care determination:   Her healthcare power of attorney and patient states that she wants to be a DNR she has been DNR in the past  CODE STATUS:  DNR status  Time spent discussing advanced care planning: 16 minutes

## 2018-05-01 NOTE — ED Notes (Signed)
First Nurse Note: Patient lives at [facility] fell from commode this AM before 0800 this AM.

## 2018-05-01 NOTE — H&P (Signed)
[employer] - [place] [facility]   PATIENT NAME: Julie Gaines    MR#:  [id number]  DATE OF BIRTH:  [birth year]  DATE OF ADMISSION:  05/01/2018  PRIMARY CARE PHYSICIAN: Bernerd Limbo   REQUESTING/REFERRING PHYSICIAN: Daryel November, MD  CHIEF COMPLAINT:   Chief Complaint  Patient presents with  . Urinary Frequency    HISTORY OF PRESENT ILLNESS: Julie Gaines  is a 73 y.o. female with a known history of anxiety, coronary artery disease, cognitive dysfunction due to vascular dementia, hyperlipidemia, hypertension, hypothyroidism who is presenting to the emergency room with burning with urination and pain with urination.  Patient also has underlying dementia and has been confused and has been forgetful.  Which is worse than her normal.  Her healthcare power of attorney name Corky Downs who is her friend is at the bedside.      PAST MEDICAL HISTORY:   Past Medical History:  Diagnosis Date  . Anxiety   . CAD (coronary artery disease)   . Cognitive changes   . Dementia (HCC)   . Hyperlipemia   . Hypertension   . Thyroid disease     PAST SURGICAL HISTORY: History reviewed. No pertinent surgical history.  SOCIAL HISTORY:  Social History   Tobacco Use  . Smoking status: Former Games developer  . Smokeless tobacco: Never Used  Substance Use Topics  . Alcohol use: No    Frequency: Never    FAMILY HISTORY: No family history on file.  DRUG ALLERGIES:  Allergies  Allergen Reactions  . Zoloft [Sertraline Hcl] Other (See Comments)    Seizure    REVIEW OF SYSTEMS:   CONSTITUTIONAL:  Patient confused unable to provide review of systems    MEDICATIONS AT HOME:  Prior to Admission medications   Not on File  Medications reviewed please see medication    PHYSICAL EXAMINATION:   VITAL SIGNS: Blood pressure 125/78, pulse 72, temperature 99.7 F (37.6 C), temperature source Oral, resp. rate 16, height 5\' 6"  (1.676 m), weight 72.6 kg, SpO2 100 %.  GENERAL:   73 y.o.-year-old patient lying in the bed with no acute distress.  EYES: Pupils equal, round, reactive to light and accommodation. No scleral icterus. Extraocular muscles intact.  HEENT: Head atraumatic, normocephalic. Oropharynx and nasopharynx clear.  NECK:  Supple, no jugular venous distention. No thyroid enlargement, no tenderness.  LUNGS: Normal breath sounds bilaterally, no wheezing, rales,rhonchi or crepitation. No use of accessory muscles of respiration.  CARDIOVASCULAR: S1, S2 normal. No murmurs, rubs, or gallops.  ABDOMEN: Soft, nontender, nondistended. Bowel sounds present. No organomegaly or mass.  EXTREMITIES: No pedal edema, cyanosis, or clubbing.  NEUROLOGIC: Cranial nerves II through XII are intact. Muscle strength 5/5 in all extremities. Sensation intact. Gait not checked.  PSYCHIATRIC: The patient is alert not oriented to place or time SKIN: No obvious rash, lesion, or ulcer.   LABORATORY PANEL:   CBC Recent Labs  Lab 05/01/18 0940  WBC 13.1*  HGB 13.1  HCT 39.3  PLT 167  MCV 94.5  MCH 31.5  MCHC 33.3  RDW 12.7   ------------------------------------------------------------------------------------------------------------------  Chemistries  Recent Labs  Lab 05/01/18 0940  NA 137  K 3.4*  CL 101  CO2 29  GLUCOSE 134*  BUN 24*  CREATININE 1.14*  CALCIUM 8.6*   ------------------------------------------------------------------------------------------------------------------ estimated creatinine clearance is 45.5 mL/min (A) (by C-G formula based on SCr of 1.14 mg/dL (H)). ------------------------------------------------------------------------------------------------------------------ No results for input(s): TSH, T4TOTAL, T3FREE, THYROIDAB in the last 72 hours.  Invalid input(s):  FREET3   Coagulation profile No results for input(s): INR, PROTIME in the last 168  hours. ------------------------------------------------------------------------------------------------------------------- No results for input(s): DDIMER in the last 72 hours. -------------------------------------------------------------------------------------------------------------------  Cardiac Enzymes No results for input(s): CKMB, TROPONINI, MYOGLOBIN in the last 168 hours.  Invalid input(s): CK ------------------------------------------------------------------------------------------------------------------ Invalid input(s): POCBNP  ---------------------------------------------------------------------------------------------------------------  Urinalysis    Component Value Date/Time   COLORURINE YELLOW (A) 05/01/2018 0940   APPEARANCEUR CLOUDY (A) 05/01/2018 0940   LABSPEC 1.018 05/01/2018 0940   PHURINE 6.0 05/01/2018 0940   GLUCOSEU NEGATIVE 05/01/2018 0940   HGBUR LARGE (A) 05/01/2018 0940   BILIRUBINUR NEGATIVE 05/01/2018 0940   KETONESUR NEGATIVE 05/01/2018 0940   PROTEINUR 100 (A) 05/01/2018 0940   NITRITE POSITIVE (A) 05/01/2018 0940   LEUKOCYTESUR MODERATE (A) 05/01/2018 0940     RADIOLOGY: Ct Head Wo Contrast  Result Date: 05/01/2018 CLINICAL DATA:  Recent fall EXAM: CT HEAD WITHOUT CONTRAST TECHNIQUE: Contiguous axial images were obtained from the base of the skull through the vertex without intravenous contrast. COMPARISON:  01/19/2018 FINDINGS: Brain: Mild atrophy is identified and stable. The ventricles are again prominent similar to that seen on the prior exam. Diffuse decreased attenuation is noted in the deep white matter bilaterally stable from the prior exam. This may represent chronic white matter ischemic change or possible transependymal absorption of CSF. Correlate for any history of normal pressure hydrocephalus. No focal hemorrhage or acute infarct is seen. No mass lesion is noted. Vascular: No hyperdense vessel or unexpected calcification. Skull:  Normal. Negative for fracture or focal lesion. Sinuses/Orbits: No acute finding. Other: None. IMPRESSION: Stable chronic changes as described above similar to that seen on the prior exam. No acute abnormality noted. Electronically Signed   By: Alcide Clever M.D.   On: 05/01/2018 12:13    EKG: Orders placed or performed during the hospital encounter of 01/19/18  . ED EKG  . ED EKG  . EKG    IMPRESSION AND PLAN: Pt is 73 y/o with dementia presenting with confusion   1. Acute encephalopathy Due to UTI We will treat with IV ceftriaxone Follow urine cultures Due to generalized weakness will obtain PT evaluation  2.  Urinary tract infection we will treat with IV ceftriaxone follow culture  3.  Cognitive dysfunction continue Xanax BuSpar, trazodone  4.  Hypertension continue therapy with lisinopril and hypertension  5.  Hypothyroidism continue Synthroid  6.  Miscellaneous Lovenox for DVT prophylax    All the records are reviewed and case discussed with ED provider. Management plans discussed with the patient, family and they are in agreement.  CODE STATUS: Advance Directive Documentation     Most Recent Value  Type of Advance Directive  Healthcare Power of Attorney Beatrix Shipper is POA]  Pre-existing out of facility DNR order (yellow form or pink MOST form)  -  "MOST" Form in Place?  -       TOTAL TIME TAKING CARE OF THIS PATIENT: 55 minutes.    Auburn Bilberry M.D on 05/01/2018 at 1:59 PM  Between 7am to 6pm - Pager - [phone]  After 6pm go to [email or web address] - Social research officer, government  [employer] Office  [phone]  CC: Primary care physician; Bernerd Limbo

## 2018-05-01 NOTE — ED Triage Notes (Signed)
Pt reports a 3 day hx of intermittent urinary frequency.  Pt states that she thinks she "has a UTI but has not has the chance to get it treated".  Pt states that today she was feeling more unwell and she slid off the toilet and was in the floor and could not get up.  Denies any injuries.

## 2018-05-01 NOTE — ED Provider Notes (Signed)
[facility] Emergency Department Provider Note   First MD Initiated Contact with Patient 05/01/18 1339     (approximate)  I have reviewed the triage vital signs and the nursing notes.   HISTORY  Chief Complaint Urinary Frequency    HPI Julie Gaines is a 73 y.o. female with below list of chronic medical conditions including dementia residing in an assisted living facility presents to the emergency department with increasing urinary frequency dysuria and urgency over the past 3 days.  Patient denies any fever however noted to be febrile in the emergency department temperature 101.2. patient denies any back pain.  Patient denies any nausea vomiting or diarrhea.  Patient's power attorney at bedside states that the patient did have an episode where she fell this morning while attempting to get off the commode.  Patient denies any head injury no loss of consciousness.  Caregiver states that the patient went to the bathroom without a walker which is unusual for her.  She also states that she notes increasing confusion.   Past Medical History:  Diagnosis Date  . Anxiety   . CAD (coronary artery disease)   . Cognitive changes   . Dementia (HCC)   . Hyperlipemia   . Hypertension   . Thyroid disease     Patient Active Problem List   Diagnosis Date Noted  . UTI (urinary tract infection) 05/01/2018    History reviewed. No pertinent surgical history.  Prior to Admission medications   Medication Sig Start Date End Date Taking? Authorizing Provider  acyclovir (ZOVIRAX) 400 MG tablet Take 400 mg by mouth 2 (two) times daily.   Yes [provider]  ALPRAZolam (XANAX) 0.5 MG tablet Take 0.5 mg by mouth daily at 3 pm.   Yes [provider]  ALPRAZolam (XANAX) 1 MG tablet Take 1.5 mg by mouth at bedtime.   Yes [provider]  alum & mag hydroxide-simeth (MYLANTA) 200-200-20 MG/5ML suspension Take 30 mLs by mouth once as needed for indigestion  or heartburn.   Yes [provider]  aspirin 81 MG chewable tablet Chew 81 mg by mouth daily.   Yes [provider]  bismuth subsalicylate (PEPTO-BISMOL) 262 MG/15ML suspension Take 15 mLs by mouth every 2 (two) hours as needed (for up to six doses in 24 hours.).   Yes [provider]  busPIRone (BUSPAR) 7.5 MG tablet Take 7.5 mg by mouth 2 (two) times daily.   Yes [provider]  camphor-menthol Wynelle Fanny(SARNA) lotion Apply 1 application topically 2 (two) times daily. To back   Yes [provider]  docusate sodium (COLACE) 100 MG capsule Take 100 mg by mouth 2 (two) times daily as needed for mild constipation.   Yes [provider]  levothyroxine (SYNTHROID, LEVOTHROID) 50 MCG tablet Take 50 mcg by mouth daily before breakfast.   Yes [provider]  lisinopril (PRINIVIL,ZESTRIL) 10 MG tablet Take 10 mg by mouth daily.   Yes [provider]  loperamide (IMODIUM A-D) 2 MG tablet Take 2 mg by mouth 4 (four) times daily as needed for diarrhea or loose stools.   Yes [provider]  magnesium hydroxide (MILK OF MAGNESIA) 400 MG/5ML suspension Take 30 mLs by mouth once as needed for mild constipation.   Yes [provider]  metoprolol tartrate (LOPRESSOR) 50 MG tablet Take 50 mg by mouth 2 (two) times daily.   Yes [provider]  polyvinyl alcohol (LUBRICANT DROPS) 1.4 % ophthalmic solution Place 1 drop  into both eyes 4 (four) times daily as needed for dry eyes.   Yes [provider]  sodium phosphate (FLEET) 7-19 GM/118ML ENEM Place 1 enema rectally.   Yes [provider]  traZODone (DESYREL) 100 MG tablet Take 100 mg by mouth at bedtime.   Yes [provider]    Allergies Zoloft [sertraline hcl]  No family history on file.  Social History Social History   Tobacco Use  . Smoking status: Former Games developermoker  . Smokeless tobacco: Never Used  Substance Use Topics  . Alcohol use: No      Frequency: Never  . Drug use: Not on file    Review of Systems Constitutional: No fever/chills Eyes: No visual changes. ENT: No sore throat. Cardiovascular: Denies chest pain. Respiratory: Denies shortness of breath. Gastrointestinal: No abdominal pain.  No nausea, no vomiting.  No diarrhea.  No constipation. Genitourinary: Positive for dysuria frequency and urgency. Musculoskeletal: Negative for neck pain.  Negative for back pain. Integumentary: Negative for rash. Neurological: Negative for headaches, focal weakness or numbness.   ____________________________________________   PHYSICAL EXAM:  VITAL SIGNS: ED Triage Vitals  Enc Vitals Group     BP 05/01/18 0935 125/78     Pulse Rate 05/01/18 0935 72     Resp 05/01/18 0935 16     Temp 05/01/18 0935 99.7 F (37.6 C)     Temp Source 05/01/18 0935 Oral     SpO2 05/01/18 0935 100 %     Weight 05/01/18 0936 72.6 kg (160 lb)     Height 05/01/18 0936 1.676 m (5\' 6" )     Head Circumference --      Peak Flow --      Pain Score 05/01/18 0935 7     Pain Loc --      Pain Edu? --      Excl. in GC? --     Constitutional: Alert and oriented. Well appearing and in no acute distress. Eyes: Conjunctivae are normal. Mouth/Throat: Mucous membranes are moist. Oropharynx non-erythematous. Neck: No stridor.   Cardiovascular: Normal rate, regular rhythm. Good peripheral circulation. Grossly normal heart sounds. Respiratory: Normal respiratory effort.  No retractions. Lungs CTAB. Gastrointestinal: Soft and nontender. No distention.   Musculoskeletal: No lower extremity tenderness nor edema. No gross deformities of extremities. Neurologic:  Normal speech and language. No gross focal neurologic deficits are appreciated.  Skin:  Skin is warm, dry and intact. No rash noted. Psychiatric: Mood and affect are normal. Speech and behavior are normal.  ____________________________________________   LABS (all labs ordered are listed, but only  abnormal results are displayed)  Labs Reviewed  URINALYSIS, COMPLETE (UACMP) WITH MICROSCOPIC - Abnormal; Notable for the following components:      Result Value   Color, Urine YELLOW (*)    APPearance CLOUDY (*)    Hgb urine dipstick LARGE (*)    Protein, ur 100 (*)    Nitrite POSITIVE (*)    Leukocytes, UA MODERATE (*)    RBC / HPF >50 (*)    WBC, UA >50 (*)    Bacteria, UA RARE (*)    All other components within normal limits  BASIC METABOLIC PANEL - Abnormal; Notable for the following components:   Potassium 3.4 (*)    Glucose, Bld 134 (*)    BUN 24 (*)    Creatinine, Ser 1.14 (*)    Calcium 8.6 (*)    GFR calc non Af Amer 48 (*)    GFR calc Af  Amer 56 (*)    All other components within normal limits  CBC - Abnormal; Notable for the following components:   WBC 13.1 (*)    All other components within normal limits  URINE CULTURE   ______________________________________  RADIOLOGY I, [place] Jordann Grime, personally viewed and evaluated these images (plain radiographs) as part of my medical decision making, as well as reviewing the written report by the radiologist.  ED MD interpretation: No acute abnormality noted on CT scan of the head per radiologist  Official radiology report(s): Ct Head Wo Contrast  Result Date: 05/01/2018 CLINICAL DATA:  Recent fall EXAM: CT HEAD WITHOUT CONTRAST TECHNIQUE: Contiguous axial images were obtained from the base of the skull through the vertex without intravenous contrast. COMPARISON:  01/19/2018 FINDINGS: Brain: Mild atrophy is identified and stable. The ventricles are again prominent similar to that seen on the prior exam. Diffuse decreased attenuation is noted in the deep white matter bilaterally stable from the prior exam. This may represent chronic white matter ischemic change or possible transependymal absorption of CSF. Correlate for any history of normal pressure hydrocephalus. No focal hemorrhage or acute infarct is seen. No mass  lesion is noted. Vascular: No hyperdense vessel or unexpected calcification. Skull: Normal. Negative for fracture or focal lesion. Sinuses/Orbits: No acute finding. Other: None. IMPRESSION: Stable chronic changes as described above similar to that seen on the prior exam. No acute abnormality noted. Electronically Signed   By: Alcide Clever M.D.   On: 05/01/2018 12:13      Procedures   ____________________________________________   INITIAL IMPRESSION / ASSESSMENT AND PLAN / ED COURSE  As part of my medical decision making, I reviewed the following data within the electronic MEDICAL RECORD NUMBER 73 year old female present with above-stated history and physical exam concerning for urinary tract infection which is consistent with urinalysis findings.  Also considered a possibility of CVA given the patient's confusion and worsening problems with mobility.  CT scan of the head was performed which revealed no acute intracranial abnormality.  Given inability to ambulate increasing confusion in the setting of urinary tract infection patient discussed with Dr. Allena Katz for hospital admission further evaluation and management.  Patient given IV ceftriaxone 1 g in the emergency department ____________________________________________  FINAL CLINICAL IMPRESSION(S) / ED DIAGNOSES  Final diagnoses:  Acute cystitis with hematuria     MEDICATIONS GIVEN DURING THIS VISIT:  Medications  acetaminophen (TYLENOL) tablet 650 mg (650 mg Oral Given 05/01/18 1453)    Or  acetaminophen (TYLENOL) suppository 650 mg ( Rectal See Alternative 05/01/18 1453)  cefTRIAXone (ROCEPHIN) 1 g in sodium chloride 0.9 % 100 mL IVPB (has no administration in time range)  acetaminophen (TYLENOL) 325 MG tablet (has no administration in time range)  cefTRIAXone (ROCEPHIN) 1 g in sodium chloride 0.9 % 100 mL IVPB (0 g Intravenous Stopped 05/01/18 1339)     ED Discharge Orders    None       Note:  This document was prepared using  Dragon voice recognition software and may include unintentional dictation errors.    Darci Current, MD 05/01/18 [phone]

## 2018-05-01 NOTE — ED Notes (Signed)
Assisted to bedside commode. Generalized weakness. Usually walks at baseline per family but has increase in falls today.

## 2018-05-01 NOTE — ED Notes (Signed)
POA states that she is concerned because pt has hx of stroke and she states that when she was on the ground pt states that she could not get up as foot was "frozen in place"  Pt does not have focal weakness in triage. Urine visibly cloudy and amber. No speech change, no visual change, no arm drift.

## 2018-05-01 NOTE — ED Notes (Signed)
Patient transported to CT 

## 2018-05-01 NOTE — ED Notes (Signed)
ED TO INPATIENT HANDOFF REPORT  Name/Age/Gender Julie Gaines 73 y.o. female  Code Status Advance Directive Documentation     Most Recent Value  Type of Advance Directive  Healthcare Power of Attorney [name] is POA]  Pre-existing out of facility DNR order (yellow form or pink MOST form)  -  "MOST" Form in Place?  -      Home/SNF/Other Home  Chief Complaint  fell off toilet mult complaints  Level of Care/Admitting Diagnosis ED Disposition    ED Disposition Condition Comment   Admit  Hospital Area: [facility] [[id number]]  Level of Care: Med-Surg [16]  Diagnosis: UTI (urinary tract infection) [573220]  Admitting Physician: Auburn Bilberry [[id number]]  Attending Physician: Auburn Bilberry [[id number]]  Estimated length of stay: past midnight tomorrow  Certification:: I certify this patient will need inpatient services for at least 2 midnights  PT Class (Do Not Modify): Inpatient [101]  PT Acc Code (Do Not Modify): Private [1]       Medical History Past Medical History:  Diagnosis Date  . Anxiety   . CAD (coronary artery disease)   . Cognitive changes   . Dementia (HCC)   . Hyperlipemia   . Hypertension   . Thyroid disease     Allergies Allergies  Allergen Reactions  . Zoloft [Sertraline Hcl] Other (See Comments)    Seizure    IV Location/Drains/Wounds Patient Lines/Drains/Airways Status   Active Line/Drains/Airways    Name:   Placement date:   Placement time:   Site:   Days:   Peripheral IV 05/01/18 Left Forearm   05/01/18    1208    Forearm   less than 1          Labs/Imaging Results for orders placed or performed during the hospital encounter of 05/01/18 (from the past 48 hour(s))  Urinalysis, Complete w Microscopic     Status: Abnormal   Collection Time: 05/01/18  9:40 AM  Result Value Ref Range   Color, Urine YELLOW (A) YELLOW   APPearance CLOUDY (A) CLEAR   Specific Gravity, Urine 1.018 1.005 - 1.030   pH 6.0 5.0 - 8.0    Glucose, UA NEGATIVE NEGATIVE mg/dL   Hgb urine dipstick LARGE (A) NEGATIVE   Bilirubin Urine NEGATIVE NEGATIVE   Ketones, ur NEGATIVE NEGATIVE mg/dL   Protein, ur 831 (A) NEGATIVE mg/dL   Nitrite POSITIVE (A) NEGATIVE   Leukocytes, UA MODERATE (A) NEGATIVE   RBC / HPF >50 (H) 0 - 5 RBC/hpf   WBC, UA >50 (H) 0 - 5 WBC/hpf   Bacteria, UA RARE (A) NONE SEEN   Squamous Epithelial / LPF 0-5 0 - 5   WBC Clumps PRESENT    Mucus PRESENT     Comment: Performed at [facility], [address] [place], [place] [address]  Basic metabolic panel     Status: Abnormal   Collection Time: 05/01/18  9:40 AM  Result Value Ref Range   Sodium 137 135 - 145 mmol/L   Potassium 3.4 (L) 3.5 - 5.1 mmol/L   Chloride 101 98 - 111 mmol/L   CO2 29 22 - 32 mmol/L   Glucose, Bld 134 (H) 70 - 99 mg/dL   BUN 24 (H) 8 - 23 mg/dL   Creatinine, Ser 6.07 (H) 0.44 - 1.00 mg/dL   Calcium 8.6 (L) 8.9 - 10.3 mg/dL   GFR calc non Af Amer 48 (L) >60 mL/min   GFR calc Af Amer 56 (L) >60 mL/min  Anion gap 7 5 - 15    Comment: Performed at [facility], [address] [place], [place] [address]  CBC     Status: Abnormal   Collection Time: 05/01/18  9:40 AM  Result Value Ref Range   WBC 13.1 (H) 4.0 - 10.5 K/uL   RBC 4.16 3.87 - 5.11 MIL/uL   Hemoglobin 13.1 12.0 - 15.0 g/dL   HCT 69.639.3 29.536.0 - 28.446.0 %   MCV 94.5 80.0 - 100.0 fL   MCH 31.5 26.0 - 34.0 pg   MCHC 33.3 30.0 - 36.0 g/dL   RDW 13.212.7 44.011.5 - 10.215.5 %   Platelets 167 150 - 400 K/uL   nRBC 0.0 0.0 - 0.2 %    Comment: Performed at [facility], [address] [place], [place] [address]   Ct Head Wo Contrast  Result Date: 05/01/2018 CLINICAL DATA:  Recent fall EXAM: CT HEAD WITHOUT CONTRAST TECHNIQUE: Contiguous axial images were obtained from the base of the skull through the vertex without intravenous contrast. COMPARISON:  01/19/2018 FINDINGS: Brain: Mild atrophy is identified and stable. The ventricles are again prominent similar  to that seen on the prior exam. Diffuse decreased attenuation is noted in the deep white matter bilaterally stable from the prior exam. This may represent chronic white matter ischemic change or possible transependymal absorption of CSF. Correlate for any history of normal pressure hydrocephalus. No focal hemorrhage or acute infarct is seen. No mass lesion is noted. Vascular: No hyperdense vessel or unexpected calcification. Skull: Normal. Negative for fracture or focal lesion. Sinuses/Orbits: No acute finding. Other: None. IMPRESSION: Stable chronic changes as described above similar to that seen on the prior exam. No acute abnormality noted. Electronically Signed   By: Alcide CleverMark  Lukens M.D.   On: 05/01/2018 12:13    Pending Labs Unresulted Labs (From admission, onward)    Start     Ordered   05/01/18 1340  Urine culture  Add-on,   AD     05/01/18 1339   Signed and Held  CBC  (enoxaparin (LOVENOX)    CrCl >/= 30 ml/min)  Once,   R    Comments:  Baseline for enoxaparin therapy IF NOT ALREADY DRAWN.  Notify MD if PLT < 100 K.    Signed and Held   Signed and Held  Creatinine, serum  (enoxaparin (LOVENOX)    CrCl >/= 30 ml/min)  Once,   R    Comments:  Baseline for enoxaparin therapy IF NOT ALREADY DRAWN.    Signed and Held   Signed and Held  Creatinine, serum  (enoxaparin (LOVENOX)    CrCl >/= 30 ml/min)  Weekly,   R    Comments:  while on enoxaparin therapy    Signed and Held   Signed and Held  Urine culture  Once,   R     Signed and Held   Signed and Held  CBC  Tomorrow morning,   R     Signed and Held   Signed and Held  Basic metabolic panel  Tomorrow morning,   R     Signed and Held          Vitals/Pain Today's Vitals   05/01/18 0935 05/01/18 0936 05/01/18 1200 05/01/18 1449  BP: 125/78   (!) 160/76  Pulse: 72   80  Resp: 16   (!) 22  Temp: 99.7 F (37.6 C)   (!) 101.2 F (38.4 C)  TempSrc: Oral   Oral  SpO2: 100%  100%  Weight:  72.6 kg    Height:  5\' 6"  (1.676 m)     PainSc: 7   5  5      Isolation Precautions No active isolations  Medications Medications  acetaminophen (TYLENOL) tablet 650 mg (650 mg Oral Given 05/01/18 1453)    Or  acetaminophen (TYLENOL) suppository 650 mg ( Rectal See Alternative 05/01/18 1453)  cefTRIAXone (ROCEPHIN) 1 g in sodium chloride 0.9 % 100 mL IVPB (has no administration in time range)  acetaminophen (TYLENOL) 325 MG tablet (has no administration in time range)  cefTRIAXone (ROCEPHIN) 1 g in sodium chloride 0.9 % 100 mL IVPB (0 g Intravenous Stopped 05/01/18 1339)    Mobility walks with person assist

## 2018-05-02 ENCOUNTER — Inpatient Hospital Stay: Payer: Medicare Other

## 2018-05-02 LAB — BASIC METABOLIC PANEL
Anion gap: 5 (ref 5–15)
BUN: 25 mg/dL — ABNORMAL HIGH (ref 8–23)
CALCIUM: 7.9 mg/dL — AB (ref 8.9–10.3)
CO2: 27 mmol/L (ref 22–32)
Chloride: 104 mmol/L (ref 98–111)
Creatinine, Ser: 1.15 mg/dL — ABNORMAL HIGH (ref 0.44–1.00)
GFR calc Af Amer: 55 mL/min — ABNORMAL LOW (ref >60)
GFR calc non Af Amer: 47 mL/min — ABNORMAL LOW (ref >60)
Glucose, Bld: 111 mg/dL — ABNORMAL HIGH (ref 70–99)
Potassium: 3.6 mmol/L (ref 3.5–5.1)
Sodium: 136 mmol/L (ref 135–145)

## 2018-05-02 LAB — CBC
HCT: 34.9 % — ABNORMAL LOW (ref 36.0–46.0)
Hemoglobin: 11.6 g/dL — ABNORMAL LOW (ref 12.0–15.0)
MCH: 31.4 pg (ref 26.0–34.0)
MCHC: 33.2 g/dL (ref 30.0–36.0)
MCV: 94.6 fL (ref 80.0–100.0)
Platelets: 131 10*3/uL — ABNORMAL LOW (ref 150–400)
RBC: 3.69 MIL/uL — ABNORMAL LOW (ref 3.87–5.11)
RDW: 12.8 % (ref 11.5–15.5)
WBC: 14 10*3/uL — AB (ref 4.0–10.5)
nRBC: 0 % (ref 0.0–0.2)

## 2018-05-02 NOTE — Progress Notes (Signed)
[employer] [facility] [place] [facility]   PATIENT NAME: Julie Gaines    MR#:  [id number]  DATE OF BIRTH:  Aug 04, 1945  SUBJECTIVE:  POA is at bedside and reports that patient is wobbly   And very unsteady  REVIEW OF SYSTEMS:    With dementia pleasantly confused   Tolerating Diet: yes      DRUG ALLERGIES:   Allergies  Allergen Reactions  . Zoloft [Sertraline Hcl] Other (See Comments)    Seizure    VITALS:  Blood pressure 138/71, pulse 78, temperature 98.6 F (37 C), temperature source Oral, resp. rate 16, height 5\' 6"  (1.676 m), weight 72.6 kg, SpO2 97 %.  PHYSICAL EXAMINATION:  Constitutional: Appears well-developed and well-nourished. No distress. HENT: Normocephalic. Marland Kitchen Oropharynx is clear and moist.  Eyes: Conjunctivae and EOM are normal. PERRLA, no scleral icterus.  Neck: Normal ROM. Neck supple. No JVD. No tracheal deviation. CVS: RRR, S1/S2 +, no murmurs, no gallops, no carotid bruit.  Pulmonary: Effort and breath sounds normal, no stridor, rhonchi, wheezes, rales.  Abdominal: Soft. BS +,  no distension, tenderness, rebound or guarding.  Musculoskeletal: Normal range of motion. No edema and no tenderness.  Neuro: Alert. CN 2-12 grossly intact. No focal deficits. Skin: Skin is warm and dry. No rash noted. Psychiatric: Patient with dementia     LABORATORY PANEL:   CBC Recent Labs  Lab 05/02/18 0350  WBC 14.0*  HGB 11.6*  HCT 34.9*  PLT 131*   ------------------------------------------------------------------------------------------------------------------  Chemistries  Recent Labs  Lab 05/02/18 0350  NA 136  K 3.6  CL 104  CO2 27  GLUCOSE 111*  BUN 25*  CREATININE 1.15*  CALCIUM 7.9*   ------------------------------------------------------------------------------------------------------------------  Cardiac Enzymes No results for input(s): TROPONINI in the last 168  hours. ------------------------------------------------------------------------------------------------------------------  RADIOLOGY:  Ct Head Wo Contrast  Result Date: 05/01/2018 CLINICAL DATA:  Recent fall EXAM: CT HEAD WITHOUT CONTRAST TECHNIQUE: Contiguous axial images were obtained from the base of the skull through the vertex without intravenous contrast. COMPARISON:  01/19/2018 FINDINGS: Brain: Mild atrophy is identified and stable. The ventricles are again prominent similar to that seen on the prior exam. Diffuse decreased attenuation is noted in the deep white matter bilaterally stable from the prior exam. This may represent chronic white matter ischemic change or possible transependymal absorption of CSF. Correlate for any history of normal pressure hydrocephalus. No focal hemorrhage or acute infarct is seen. No mass lesion is noted. Vascular: No hyperdense vessel or unexpected calcification. Skull: Normal. Negative for fracture or focal lesion. Sinuses/Orbits: No acute finding. Other: None. IMPRESSION: Stable chronic changes as described above similar to that seen on the prior exam. No acute abnormality noted. Electronically Signed   By: Alcide Clever M.D.   On: 05/01/2018 12:13     ASSESSMENT AND PLAN:   73 year old female with a history of vascular dementia and hypothyroidism who presented to the ER due to urinary frequency and unsteady gait.  1.  Acute metabolic encephalopathy in the setting of urinary tract infection with underlying dementia. Will order MRI to evaluate for CVA as power of attorney is reporting patient is very wobbly gait  2.  UTI: Continue Rocephin and follow-up on final urine culture  3.  Dementia: Patient appears to be at baseline  4.  Hypothyroidism: Continue Synthroid  5.  Essential hypertension: Continue lisinopril and metoprolol  PT eval for d/c planning  Management plans discussed with the patient's POA and she is in agreement.  CODE STATUS:  DNR  TOTAL TIME TAKING CARE OF THIS PATIENT: 30 minutes.     POSSIBLE D/C tomorrow, DEPENDING ON CLINICAL CONDITION.   Isador Castille M.D on 05/02/2018 at 11:13 AM  Between 7am to 6pm - Pager - [phone] After 6pm go to [email or web address] - password [email or web address]  [facility]  Office  [phone]  CC: Primary care physician; Bernerd Limbo  Note: This dictation was prepared with Dragon dictation along with smaller phrase technology. Any transcriptional errors that result from this process are unintentional.

## 2018-05-02 NOTE — Evaluation (Signed)
Physical Therapy Evaluation Patient Details Name: Julie Gaines MRN: [id number] DOB: [birth year] Today's Date: 05/02/2018   History of Present Illness  Pt is a 73 y.o. female presenting to hospital 05/01/18 after sliding off of toilet; pt with burning/pain with urination and increased urinary frequency; pt also noted with increased confusion from baseline.  Pt admitted with acute encephalopathy d/t UTI.  PMH includes anxiety, CAD, cognitive dysfunction d/t vascular dementia, and htn.  Clinical Impression  Prior to hospital admission, pt reports being modified independent ambulating with rollator.  Pt lives at [facility].  Currently pt is independent with bed mobility and CGA with transfer bed to/from [facility] (see transfer section for details).  Attempted to have pt use walker but pt pushed walker out of way transferring to commode and then refused to use walker to transfer back to bed.  Pt preferring to have single UE support on select furniture during transfers and pt appeared steady with pt's method.  Attempted to encourage pt to ambulate during session multiple times but pt refusing d/t wanting to "sleep" and only wanted to use BSC to urinate.  Pt would benefit from skilled PT to address noted impairments and functional limitations (see below for any additional details) although limited session/assessment per pt's willingness to participate.  Upon hospital discharge, recommend pt discharge back to facility with HHPT and initial supervision for mobility/OOB for safety.    Follow Up Recommendations Home health PT;Supervision for mobility/OOB    Equipment Recommendations  Rolling walker with 5" wheels    Recommendations for Other Services       Precautions / Restrictions Precautions Precautions: Fall Restrictions Weight Bearing Restrictions: No      Mobility  Bed Mobility Overal bed mobility: Independent             General bed mobility comments: Supine to/from sit without any noted  difficulties; bed flat  Transfers Overall transfer level: Needs assistance Equipment used: (single UE support) Transfers: Sit to/from [employer] Sit to Stand: Min guard Stand pivot transfers: Min guard       General transfer comment: pt stood from bed with single UE support on bed rail and stood from [facility] with single UE support on armrest; pt given walker to use to transfer bed to [facility] but pt just pushed walker out of the way, took a couple steps, and then placed single UE support on BSC armrest prior to sitting down safely; pt declined use of walker to transfer back to bed and instead (after standing) leaned forward to place single UE support on mattress, took a couple steps, and then safely sat on bed  Ambulation/Gait             General Gait Details: Pt refused  Stairs            Wheelchair Mobility    Modified Rankin (Stroke Patients Only)       Balance Overall balance assessment: Needs assistance Sitting-balance support: No upper extremity supported Sitting balance-Leahy Scale: Normal Sitting balance - Comments: steady sitting reaching outside BOS   Standing balance support: No upper extremity supported Standing balance-Leahy Scale: Good Standing balance comment: steady standing managing briefs for toileting                             Pertinent Vitals/Pain Pain Assessment: 0-10 Pain Score: 5  Pain Location: general body aches from "Lyme" disease per pt report Pain Intervention(s): Limited activity within patient's  tolerance;Monitored during session;Repositioned;Other (comment)(RN notified)  Vitals (HR and O2 on room air) stable and WFL throughout treatment session.    Home Living Family/patient expects to be discharged to:: Assisted living Living Arrangements: Other (Comment)             Home Equipment: Walker - 4 wheels Additional Comments: Pt lives at [facility].    Prior Function Level of Independence: Needs  assistance   Gait / Transfers Assistance Needed: Pt reports ambulating in facility with rollator on her own; assist for meals           Hand Dominance        Extremity/Trunk Assessment   Upper Extremity Assessment Upper Extremity Assessment: Generalized weakness(pt declined to participate in assessment)    Lower Extremity Assessment Lower Extremity Assessment: Generalized weakness(pt declined to participate in assessment)    Cervical / Trunk Assessment Cervical / Trunk Assessment: Normal  Communication   Communication: No difficulties  Cognition Arousal/Alertness: Awake/alert Behavior During Therapy: WFL for tasks assessed/performed Overall Cognitive Status: No family/caregiver present to determine baseline cognitive functioning                                 General Comments: Oriented to person, place, and time; pt reports she does not know why she is at hospital      General Comments   Nursing cleared pt for participation in physical therapy.    Exercises     Assessment/Plan    PT Assessment Patient needs continued PT services  PT Problem List Decreased strength;Decreased activity tolerance;Decreased mobility;Decreased knowledge of precautions       PT Treatment Interventions DME instruction;Gait training;Functional mobility training;Therapeutic activities;Therapeutic exercise;Balance training;Patient/family education    PT Goals (Current goals can be found in the Care Plan section)  Acute Rehab PT Goals Patient Stated Goal: to get some rest PT Goal Formulation: With patient Time For Goal Achievement: 05/16/18 Potential to Achieve Goals: Good    Frequency Min 2X/week   Barriers to discharge        Co-evaluation               AM-PAC PT "6 Clicks" Mobility  Outcome Measure Help needed turning from your back to your side while in a flat bed without using bedrails?: None Help needed moving from lying on your back to sitting on the  side of a flat bed without using bedrails?: None Help needed moving to and from a bed to a chair (including a wheelchair)?: A Little Help needed standing up from a chair using your arms (e.g., wheelchair or bedside chair)?: A Little Help needed to walk in hospital room?: A Little Help needed climbing 3-5 steps with a railing? : A Lot 6 Click Score: 19    End of Session Equipment Utilized During Treatment: Gait belt Activity Tolerance: Patient tolerated treatment well Patient left: in bed;with call bell/phone within reach;with bed alarm set Nurse Communication: Mobility status;Precautions PT Visit Diagnosis: Other abnormalities of gait and mobility (R26.89);Muscle weakness (generalized) (M62.81);History of falling (Z91.81)    Time: 9798-9211 PT Time Calculation (min) (ACUTE ONLY): 23 min   Charges:   PT Evaluation $PT Eval Low Complexity: 1 Low         Kayliegh Boyers, PT 05/02/18, 3:46 PM [phone]

## 2018-05-03 ENCOUNTER — Inpatient Hospital Stay: Payer: Medicare Other

## 2018-05-03 MED ORDER — CIPROFLOXACIN HCL 500 MG PO TABS: 500.0000 mg | ORAL_TABLET | Freq: Two times a day (BID) | ORAL | 0 refills | Status: AC

## 2018-05-03 NOTE — Discharge Summary (Addendum)
[facility] [place] [facility]   PATIENT NAME: Julie Gaines    MR#:  [id number]  DATE OF BIRTH:  March 15, 1946  DATE OF ADMISSION:  05/01/2018 ADMITTING PHYSICIAN: Auburn Bilberry, MD  DATE OF DISCHARGE: 05/07/2018  PRIMARY CARE PHYSICIAN: Bernerd Limbo    ADMISSION DIAGNOSIS:  Acute cystitis with hematuria [N30.01]  DISCHARGE DIAGNOSIS:  Active Problems:   UTI (urinary tract infection)   SECONDARY DIAGNOSIS:   Past Medical History:  Diagnosis Date  . Anxiety   . CAD (coronary artery disease)   . Cognitive changes   . Dementia (HCC)   . Hyperlipemia   . Hypertension   . Thyroid disease     HOSPITAL COURSE:   73 year old female with a history of vascular dementia and hypothyroidism who presented to the ER due to urinary frequency and unsteady gait.  1.  Acute metabolic encephalopathy in the setting of urinary tract infection with underlying dementia. Patient is at baseline.  MRI negative for acute stroke.  2.  E Coli UTI: finished treatment for UTI with IV abx and also oral Cipro.   3.  Dementia: Patient appears to be at baseline  4.  Hypothyroidism: Continue Synthroid  5.  Essential hypertension: Continue lisinopril and metoprolol - BP stable.   6. Left foot pain - X-ray (-) this a.m. likely related to osteoarthritis.  Continue supportive care with pain control with Tylenol/Motrin.  Pt. Will be discharged home with Home Health.   DISCHARGE CONDITIONS AND DIET:   Stable for discharge on regular diet  CONSULTS OBTAINED:    DRUG ALLERGIES:   Allergies  Allergen Reactions  . Zoloft [Sertraline Hcl] Other (See Comments)    Seizure    DISCHARGE MEDICATIONS:   Allergies as of 05/03/2018      Reactions   Zoloft [sertraline Hcl] Other (See Comments)   Seizure      Medication List    TAKE these medications   acyclovir 400 MG tablet Commonly known as:  ZOVIRAX Take 400 mg by mouth 2 (two) times daily.   ALPRAZolam 0.5 MG  tablet Commonly known as:  XANAX Take 0.5 mg by mouth daily at 3 pm.   ALPRAZolam 1 MG tablet Commonly known as:  XANAX Take 1.5 mg by mouth at bedtime.   aspirin 81 MG chewable tablet Chew 81 mg by mouth daily.   busPIRone 7.5 MG tablet Commonly known as:  BUSPAR Take 7.5 mg by mouth 2 (two) times daily.   camphor-menthol lotion Commonly known as:  SARNA Apply 1 application topically 2 (two) times daily. To back   ciprofloxacin 500 MG tablet Commonly known as:  CIPRO Take 1 tablet (500 mg total) by mouth 2 (two) times daily for 2 days.   docusate sodium 100 MG capsule Commonly known as:  COLACE Take 100 mg by mouth 2 (two) times daily as needed for mild constipation.   levothyroxine 50 MCG tablet Commonly known as:  SYNTHROID, LEVOTHROID Take 50 mcg by mouth daily before breakfast.   lisinopril 10 MG tablet Commonly known as:  PRINIVIL,ZESTRIL Take 10 mg by mouth daily.   loperamide 2 MG tablet Commonly known as:  IMODIUM A-D Take 2 mg by mouth 4 (four) times daily as needed for diarrhea or loose stools.   LUBRICANT DROPS 1.4 % ophthalmic solution Generic drug:  polyvinyl alcohol Place 1 drop into both eyes 4 (four) times daily as needed for dry eyes.   metoprolol tartrate 50 MG tablet Commonly known as:  LOPRESSOR  Take 50 mg by mouth 2 (two) times daily.   MILK OF MAGNESIA 400 MG/5ML suspension Generic drug:  magnesium hydroxide Take 30 mLs by mouth once as needed for mild constipation.   MYLANTA 200-200-20 MG/5ML suspension Generic drug:  alum & mag hydroxide-simeth Take 30 mLs by mouth once as needed for indigestion or heartburn.   PEPTO-BISMOL 262 MG/15ML suspension Generic drug:  bismuth subsalicylate Take 15 mLs by mouth every 2 (two) hours as needed (for up to six doses in 24 hours.).   sodium phosphate 7-19 GM/118ML Enem Place 1 enema rectally.   traZODone 100 MG tablet Commonly known as:  DESYREL Take 100 mg by mouth at bedtime.          Today   CHIEF COMPLAINT:   No acute events overnight   VITAL SIGNS:  Blood pressure 126/88, pulse 68, temperature 98.7 F (37.1 C), temperature source Oral, resp. rate 20, height 5\' 6"  (1.676 m), weight 72.6 kg, SpO2 95 %.   REVIEW OF SYSTEMS:  Review of Systems  Unable to perform ROS: Dementia     PHYSICAL EXAMINATION:  GENERAL:  73 y.o.-year-old patient lying in the bed with no acute distress.  NECK:  Supple, no jugular venous distention. No thyroid enlargement, no tenderness.  LUNGS: Normal breath sounds bilaterally, no wheezing, rales,rhonchi  No use of accessory muscles of respiration.  CARDIOVASCULAR: S1, S2 normal. No murmurs, rubs, or gallops.  ABDOMEN: Soft, non-tender, non-distended. Bowel sounds present. No organomegaly or mass.  EXTREMITIES: No pedal edema, cyanosis, or clubbing.  PSYCHIATRIC: The patient is alert and oriented x name not place or time  SKIN: No obvious rash, lesion, or ulcer.   DATA REVIEW:   CBC Recent Labs  Lab 05/02/18 0350  WBC 14.0*  HGB 11.6*  HCT 34.9*  PLT 131*    Chemistries  Recent Labs  Lab 05/02/18 0350  NA 136  K 3.6  CL 104  CO2 27  GLUCOSE 111*  BUN 25*  CREATININE 1.15*  CALCIUM 7.9*    Cardiac Enzymes No results for input(s): TROPONINI in the last 168 hours.  Microbiology Results  @MICRORSLT48 @  RADIOLOGY:  Dg Chest 1 View  Result Date: 05/03/2018 CLINICAL DATA:  Fever. EXAM: CHEST  1 VIEW COMPARISON:  None. FINDINGS: The heart shadow appears mildly enlarged but size is poorly evaluated due to portable technique. There appears to be a tortuous thoracic aorta. The hila and mediastinum are otherwise unremarkable. No pneumothorax. No nodules or masses. No focal infiltrate to suggest pneumonia. IMPRESSION: No cause for fever identified. Electronically Signed   By: Gerome Samavid  Williams III M.D   On: 05/03/2018 08:36   Ct Head Wo Contrast  Result Date: 05/01/2018 CLINICAL DATA:  Recent fall EXAM: CT HEAD  WITHOUT CONTRAST TECHNIQUE: Contiguous axial images were obtained from the base of the skull through the vertex without intravenous contrast. COMPARISON:  01/19/2018 FINDINGS: Brain: Mild atrophy is identified and stable. The ventricles are again prominent similar to that seen on the prior exam. Diffuse decreased attenuation is noted in the deep white matter bilaterally stable from the prior exam. This may represent chronic white matter ischemic change or possible transependymal absorption of CSF. Correlate for any history of normal pressure hydrocephalus. No focal hemorrhage or acute infarct is seen. No mass lesion is noted. Vascular: No hyperdense vessel or unexpected calcification. Skull: Normal. Negative for fracture or focal lesion. Sinuses/Orbits: No acute finding. Other: None. IMPRESSION: Stable chronic changes as described above similar to that seen on  the prior exam. No acute abnormality noted. Electronically Signed   By: Alcide Clever M.D.   On: 05/01/2018 12:13   Mr Brain Wo Contrast  Result Date: 05/02/2018 CLINICAL DATA:  Stroke follow-up EXAM: MRI HEAD WITHOUT CONTRAST TECHNIQUE: Multiplanar, multiecho pulse sequences of the brain and surrounding structures were obtained without intravenous contrast. COMPARISON:  CT head 05/01/2018 FINDINGS: Brain: Negative for acute infarct. Mild atrophy. Mild ventricular enlargement is unchanged from prior studies. Extensive hyperintensity throughout the cerebral white matter bilaterally. Mild hyperintensity basal ganglia and pons bilaterally. Negative for hemorrhage or mass. No midline shift. Vascular: Normal arterial flow voids Skull and upper cervical spine: Negative Sinuses/Orbits: Mild mucosal edema paranasal sinuses. Left mastoid effusion. Normal orbit Other: None IMPRESSION: Atrophy and extensive chronic microvascular ischemia. No acute infarct. Electronically Signed   By: Marlan Palau M.D.   On: 05/02/2018 14:31      Allergies as of 05/03/2018       Reactions   Zoloft [sertraline Hcl] Other (See Comments)   Seizure      Medication List    TAKE these medications   acyclovir 400 MG tablet Commonly known as:  ZOVIRAX Take 400 mg by mouth 2 (two) times daily.   ALPRAZolam 0.5 MG tablet Commonly known as:  XANAX Take 0.5 mg by mouth daily at 3 pm.   ALPRAZolam 1 MG tablet Commonly known as:  XANAX Take 1.5 mg by mouth at bedtime.   aspirin 81 MG chewable tablet Chew 81 mg by mouth daily.   busPIRone 7.5 MG tablet Commonly known as:  BUSPAR Take 7.5 mg by mouth 2 (two) times daily.   camphor-menthol lotion Commonly known as:  SARNA Apply 1 application topically 2 (two) times daily. To back   ciprofloxacin 500 MG tablet Commonly known as:  CIPRO Take 1 tablet (500 mg total) by mouth 2 (two) times daily for 2 days.   docusate sodium 100 MG capsule Commonly known as:  COLACE Take 100 mg by mouth 2 (two) times daily as needed for mild constipation.   levothyroxine 50 MCG tablet Commonly known as:  SYNTHROID, LEVOTHROID Take 50 mcg by mouth daily before breakfast.   lisinopril 10 MG tablet Commonly known as:  PRINIVIL,ZESTRIL Take 10 mg by mouth daily.   loperamide 2 MG tablet Commonly known as:  IMODIUM A-D Take 2 mg by mouth 4 (four) times daily as needed for diarrhea or loose stools.   LUBRICANT DROPS 1.4 % ophthalmic solution Generic drug:  polyvinyl alcohol Place 1 drop into both eyes 4 (four) times daily as needed for dry eyes.   metoprolol tartrate 50 MG tablet Commonly known as:  LOPRESSOR Take 50 mg by mouth 2 (two) times daily.   MILK OF MAGNESIA 400 MG/5ML suspension Generic drug:  magnesium hydroxide Take 30 mLs by mouth once as needed for mild constipation.   MYLANTA 200-200-20 MG/5ML suspension Generic drug:  alum & mag hydroxide-simeth Take 30 mLs by mouth once as needed for indigestion or heartburn.   PEPTO-BISMOL 262 MG/15ML suspension Generic drug:  bismuth subsalicylate Take 15 mLs by  mouth every 2 (two) hours as needed (for up to six doses in 24 hours.).   sodium phosphate 7-19 GM/118ML Enem Place 1 enema rectally.   traZODone 100 MG tablet Commonly known as:  DESYREL Take 100 mg by mouth at bedtime.          Management plans discussed with the patient's POA and she is in agreement. Stable for discharge with [facility]  Patient should follow up with pcp  CODE STATUS:     Code Status Orders  (From admission, onward)         Start     Ordered   05/01/18 1537  Do not attempt resuscitation (DNR)  Continuous    Question Answer Comment  In the event of cardiac or respiratory ARREST Do not call a "code blue"   In the event of cardiac or respiratory ARREST Do not perform Intubation, CPR, defibrillation or ACLS   In the event of cardiac or respiratory ARREST Use medication by any route, position, wound care, and other measures to relive pain and suffering. May use oxygen, suction and manual treatment of airway obstruction as needed for comfort.      05/01/18 1536        Code Status History    This patient has a current code status but no historical code status.    Advance Directive Documentation     Most Recent Value  Type of Advance Directive  Healthcare Power of Attorney  Pre-existing out of facility DNR order (yellow form or pink MOST form)  -  "MOST" Form in Place?  -      TOTAL TIME TAKING CARE OF THIS PATIENT: 38 minutes.    Note: This dictation was prepared with Dragon dictation along with smaller phrase technology. Any transcriptional errors that result from this process are unintentional.  MODY, SITAL M.D on 05/03/2018 at 10:30 AM  Between 7am to 6pm - Pager - [phone]  After 6pm go to [email or web address] - Social research officer, governmentpassword [email or web address] [email or web address]  [facility]  Office  [phone]  CC: Primary care physician; Bernerd LimboMitchell, John

## 2018-05-03 NOTE — Care Management (Addendum)
Per HPOA Beatrix Shipper she has filed and appeal against discharge.  She has contacted [facility] and detailed notice of discharge signed.  Olegario Messier Allmond CMA notified.  MD notified.   PT has assessed patient and recommended home health.  When CSW spoke with POA she was agreeable to home health services, and did not have a preference of agency

## 2018-05-03 NOTE — Plan of Care (Signed)
Text to inform Dr. Barbaraann Faster POA still concerned about patient's continued "weakness" and is requesting possible neurology consult.

## 2018-05-03 NOTE — NC FL2 (Addendum)
[place] MEDICAID FL2 LEVEL OF CARE SCREENING TOOL     IDENTIFICATION  Patient Name: Julie Gaines Birthdate: [birth year] Sex: female Admission Date (Current Location): 05/01/2018  [place]   Facility and Address:  [facility], [address], [place], [place] [address]      Provider Number: [id number]  Attending Physician Name and Address:  Adrian SaranMody, Sital, MD  Relative Name and Phone Number:       Current Level of Care: Hospital Recommended Level of Care: Assisted Living Facility: Memory Care  Prior Approval Number:    Date Approved/Denied:   PASRR Number:    Discharge Plan: (ALF) Memory Care    Current Diagnoses: Vascular Dementia without behavioral disturbances. Patient Active Problem List   Diagnosis Date Noted  . UTI (urinary tract infection) 05/01/2018    Orientation RESPIRATION BLADDER Height & Weight     Self, Place, Situation  Normal Continent Weight: 160 lb (72.6 kg) Height:  5\' 6"  (167.6 cm)  BEHAVIORAL SYMPTOMS/MOOD NEUROLOGICAL BOWEL NUTRITION STATUS  (none) (none) Continent Diet(heart healthy)  AMBULATORY STATUS COMMUNICATION OF NEEDS Skin   Limited Assist Verbally Normal                       Personal Care Assistance Level of Assistance  Bathing, Dressing Bathing Assistance: Limited assistance   Dressing Assistance: Limited assistance     Functional Limitations Info  (none noted)          SPECIAL CARE FACTORS FREQUENCY  PT (By licensed PT)(home health PT)                    Contractures Contractures Info: Not present    Additional Factors Info  Code Status               Allergies as of 05/03/2018      Reactions   Zoloft [sertraline Hcl] Other (See Comments)   Seizure         Medication List    TAKE these medications   acyclovir 400 MG tablet Commonly known as:  ZOVIRAX Take 400 mg by mouth 2 (two) times daily.   ALPRAZolam 0.5 MG tablet Commonly  known as:  XANAX Take 0.5 mg by mouth daily at 3 pm.   ALPRAZolam 1 MG tablet Commonly known as:  XANAX Take 1.5 mg by mouth at bedtime.   aspirin 81 MG chewable tablet Chew 81 mg by mouth daily.   busPIRone 7.5 MG tablet Commonly known as:  BUSPAR Take 7.5 mg by mouth 2 (two) times daily.   camphor-menthol lotion Commonly known as:  SARNA Apply 1 application topically 2 (two) times daily. To back   ciprofloxacin 500 MG tablet Commonly known as:  CIPRO Take 1 tablet (500 mg total) by mouth 2 (two) times daily for 2 days.   docusate sodium 100 MG capsule Commonly known as:  COLACE Take 100 mg by mouth 2 (two) times daily as needed for mild constipation.   levothyroxine 50 MCG tablet Commonly known as:  SYNTHROID, LEVOTHROID Take 50 mcg by mouth daily before breakfast.   lisinopril 10 MG tablet Commonly known as:  PRINIVIL,ZESTRIL Take 10 mg by mouth daily.   loperamide 2 MG tablet Commonly known as:  IMODIUM A-D Take 2 mg by mouth 4 (four) times daily as needed for diarrhea or loose stools.   LUBRICANT DROPS 1.4 % ophthalmic solution Generic drug:  polyvinyl alcohol Place 1 drop into both  eyes 4 (four) times daily as needed for dry eyes.   metoprolol tartrate 50 MG tablet Commonly known as:  LOPRESSOR Take 50 mg by mouth 2 (two) times daily.   MILK OF MAGNESIA 400 MG/5ML suspension Generic drug:  magnesium hydroxide Take 30 mLs by mouth once as needed for mild constipation.   MYLANTA 200-200-20 MG/5ML suspension Generic drug:  alum & mag hydroxide-simeth Take 30 mLs by mouth once as needed for indigestion or heartburn.   PEPTO-BISMOL 262 MG/15ML suspension Generic drug:  bismuth subsalicylate Take 15 mLs by mouth every 2 (two) hours as needed (for up to six doses in 24 hours.).   sodium phosphate 7-19 GM/118ML Enem Place 1 enema rectally.   traZODone 100 MG tablet Commonly known as:  DESYREL Take 100 mg by mouth at bedtime.         Additional Information    York Spaniel, LCSW

## 2018-05-03 NOTE — Discharge Instructions (Signed)
Urinary Tract Infection, Adult A urinary tract infection (UTI) is an infection of any part of the urinary tract. The urinary tract includes:  The kidneys.  The ureters.  The bladder.  The urethra. These organs make, store, and get rid of pee (urine) in the body. What are the causes? This is caused by germs (bacteria) in your genital area. These germs grow and cause swelling (inflammation) of your urinary tract. What increases the risk? You are more likely to develop this condition if:  You have a small, thin tube (catheter) to drain pee.  You cannot control when you pee or poop (incontinence).  You are female, and: ? You use these methods to prevent pregnancy: ? A medicine that kills sperm (spermicide). ? A device that blocks sperm (diaphragm). ? You have low levels of a female hormone (estrogen). ? You are pregnant.  You have genes that add to your risk.  You are sexually active.  You take antibiotic medicines.  You have trouble peeing because of: ? A prostate that is bigger than normal, if you are female. ? A blockage in the part of your body that drains pee from the bladder (urethra). ? A kidney stone. ? A nerve condition that affects your bladder (neurogenic bladder). ? Not getting enough to drink. ? Not peeing often enough.  You have other conditions, such as: ? Diabetes. ? A weak disease-fighting system (immune system). ? Sickle cell disease. ? Gout. ? Injury of the spine. What are the signs or symptoms? Symptoms of this condition include:  Needing to pee right away (urgently).  Peeing often.  Peeing small amounts often.  Pain or burning when peeing.  Blood in the pee.  Pee that smells bad or not like normal.  Trouble peeing.  Pee that is cloudy.  Fluid coming from the vagina, if you are female.  Pain in the belly or lower back. Other symptoms include:  Throwing up (vomiting).  No urge to eat.  Feeling mixed up (confused).  Being tired  and grouchy (irritable).  A fever.  Watery poop (diarrhea). How is this treated? This condition may be treated with:  Antibiotic medicine.  Other medicines.  Drinking enough water. Follow these instructions at home:  Medicines  Take over-the-counter and prescription medicines only as told by your doctor.  If you were prescribed an antibiotic medicine, take it as told by your doctor. Do not stop taking it even if you start to feel better. General instructions  Make sure you: ? Pee until your bladder is empty. ? Do not hold pee for a long time. ? Empty your bladder after sex. ? Wipe from front to back after pooping if you are a female. Use each tissue one time when you wipe.  Drink enough fluid to keep your pee pale yellow.  Keep all follow-up visits as told by your doctor. This is important. Contact a doctor if:  You do not get better after 1-2 days.  Your symptoms go away and then come back. Get help right away if:  You have very bad back pain.  You have very bad pain in your lower belly.  You have a fever.  You are sick to your stomach (nauseous).  You are throwing up. Summary  A urinary tract infection (UTI) is an infection of any part of the urinary tract.  This condition is caused by germs in your genital area.  There are many risk factors for a UTI. These include having a small, thin   tube to drain pee and not being able to control when you pee or poop.  Treatment includes antibiotic medicines for germs.  Drink enough fluid to keep your pee pale yellow. This information is not intended to replace advice given to you by your health care provider. Make sure you discuss any questions you have with your health care provider. Document Released: 09/14/2007 Document Revised: 10/05/2017 Document Reviewed: 10/05/2017 Elsevier Interactive Patient Education  2019 [employer]vier Inc[employer]  

## 2018-05-03 NOTE — Clinical Social Work Note (Signed)
Clinical Social Work Assessment  Patient Details  Name: Julie Gaines MRN: [id number] Date of Birth: 1946/03/04  Date of referral:  05/03/18               Reason for consult:  Discharge Planning                Permission sought to share information with:    Permission granted to share information::     Name::        Agency::     Relationship::     Contact Information:     Housing/Transportation Living arrangements for the past 2 months:  Assisted Living Facility Source of Information:  (Health Care Power of [place]) Patient Interpreter Needed:  None Criminal Activity/Legal Involvement Pertinent to Current Situation/Hospitalization:  No - Comment as needed Significant Relationships:  Friend Lives with:  Facility Resident Do you feel safe going back to the place where you live?  Yes Need for family participation in patient care:  Yes (Comment)  Care giving concerns:  Patient resides long term at [facility].   Social Worker assessment / plan:  Physician discharged patient today to return to [facility]. CSW contacted Liborio Nixon at [facility] and she came to assess and stated patient could return. PT assessed patient and stated patient could return to [facility] with home health and 24 hour assist or SNF. CSW contacted patient's HPOA: Beatrix Shipper: [phone] via phone and explained the above. Rosey Bath initially requested the MD call her again so she could request patient stay one more night to meet qualifications for SNF under medicare. Then during the conversation she stated she did not feel that she was medically ready for discharge and she stated that she was going to appeal the discharge. CSW provided her with Mosie Lukes number to contact to initiate the appeal. Discharge order will remain active. CSW has notified Liborio Nixon at [facility].  Employment status:    Insurance information:  Medicare PT Recommendations:  Home with Home  Health Information / Referral to community resources:     Patient/Family's Response to care:  Patient's HPOA was thankful for phone call by CSW.  Patient/Family's Understanding of and Emotional Response to Diagnosis, Current Treatment, and Prognosis: Patient's HPOA does not believe that patient is medically ready for discharge.    Emotional Assessment Appearance:  Appears stated age Attitude/Demeanor/Rapport:    Affect (typically observed):  Calm Orientation:  Oriented to Self Alcohol / Substance use:  Not Applicable Psych involvement (Current and /or in the community):  No (Comment)  Discharge Needs  Concerns to be addressed:  Care Coordination Readmission within the last 30 days:  No Current discharge risk:  None Barriers to Discharge:  No Barriers Identified   York Spaniel, LCSW 05/03/2018, 1:44 PM

## 2018-05-03 NOTE — Progress Notes (Signed)
Physical Therapy Treatment Patient Details Name: Julie Gaines MRN: [id number] DOB: 07-14-1945 Today's Date: 05/03/2018    History of Present Illness Pt is a 73 y.o. female presenting to hospital 05/01/18 after sliding off of toilet; pt with burning/pain with urination and increased urinary frequency; pt also noted with increased confusion from baseline.  Pt admitted with acute encephalopathy d/t UTI.  PMH includes anxiety, CAD, cognitive dysfunction d/t vascular dementia, and htn.    PT Comments     PT received phone call from nurse that pt requesting PT come so pt could walk.  Therapist came to pt's room shortly after and pt stating "now?" but then agreeable to participating.  Pt requiring cueing for UE placement to stand from bed and upon standing up to walker pt noted with sway (pt reporting h/o stroke and this happens at times and when it happened, she just sat back down or layed down in bed); pt assisted back to sitting on edge of bed; sway noted sitting edge of bed but then stopped (occasional sway noted in sitting during session but inconsistent).  Pt then stood again with sway noted in standing again; therapist asked pt again what she did when this happened and she stated that she just went along with her activities (walking to dining hall or to bathroom).  Therapist attempted to ask about falls but pt vague in response (other than she had h/o falls) and tangential (unable to get any specific answers).  Pt then instructed to walk but pt walked a few feet with walker (minimal sway noted) and sat in chair.  During session pt c/o her facility and that there was no comfortable chair in the entire facility to sit in and she just layed in bed all day (did not like her bed) and did not like her current facility.  Pt agreeable to attempt walking again. Pt stood well from recliner but upon taking steps, pt noted to be stepping forward, to side, backwards, and then forwards again with walker occasionally  tipping walker requiring min assist for balance; pt then reporting her knees were going to buckle so pt assisted to sitting in chair for safety.  Pt again reporting this was "typical" for her and would come and go.  Pt's sway in sitting, standing, and ambulation inconsistent.  No family present to verify information.  Discussed concerns with pt's nurse, CM, and SW.  If this is pt's baseline functional status (with fluctuating mobility needs), anticipate pt able to discharge back to facility with 24/7 assist with functional mobility for safety (and HHPT).  If this is new (or facility unable to provide required 24/7 assist for safety), pt would benefit from STR to improve functional mobility.   Follow Up Recommendations  Home health PT;Supervision/Assistance - 24 hour     Equipment Recommendations  Rolling walker with 5" wheels;Wheelchair (measurements PT);Wheelchair cushion (measurements PT)    Recommendations for Other Services       Precautions / Restrictions Precautions Precautions: Fall Restrictions Weight Bearing Restrictions: No    Mobility  Bed Mobility Overal bed mobility: Independent             General bed mobility comments: Supine to sit without any noted difficulties; bed flat  Transfers Overall transfer level: Needs assistance   Transfers: Sit to/from Stand;Stand Pivot Transfers Sit to Stand: Min guard;Min assist Stand pivot transfers: Min guard;Min assist       General transfer comment: pt requiring initial cueing for B UE placement to push off  of bed to stand (pt trying to standing initially with B UE support on walker); good carryover with transfers after that; CGA to min assist for balance with transfers  Ambulation/Gait Ambulation/Gait assistance: Min guard;Min assist Gait Distance (Feet): 5 Feet Assistive device: Rolling walker (2 wheeled)   Gait velocity: decreased   General Gait Details: pt stepping forward, to side, backwards, and then forwards  again with walker occasionally tipping walker requiring min assist for balance; pt then reporting her knees were going to buckle so pt assisted to sitting in chair for safety   Stairs             Wheelchair Mobility    Modified Rankin (Stroke Patients Only)       Balance Overall balance assessment: Needs assistance Sitting-balance support: No upper extremity supported Sitting balance-Leahy Scale: Good Sitting balance - Comments: steady sitting reaching within BOS (pt noted to be swaying at times but inconsistent; SBA for safety)   Standing balance support: Single extremity supported Standing balance-Leahy Scale: Poor Standing balance comment: pt requiring at least single UE support for static stating balance (pt swaying at times but inconsistent; CGA for safety)                            Cognition Arousal/Alertness: Awake/alert Behavior During Therapy: WFL for tasks assessed/performed Overall Cognitive Status: No family/caregiver present to determine baseline cognitive functioning                                 General Comments: Oriented to person, place, and time      Exercises      General Comments   Nursing cleared pt for participation in physical therapy.  Pt agreeable to PT session.      Pertinent Vitals/Pain Pain Assessment: Faces Faces Pain Scale: Hurts a little bit Pain Location: general body aches from "Lyme" disease per pt report Pain Intervention(s): Limited activity within patient's tolerance;Monitored during session;Repositioned  HR WFL during session    Home Living                      Prior Function            PT Goals (current goals can now be found in the care plan section) Acute Rehab PT Goals Patient Stated Goal: to improve mobility PT Goal Formulation: With patient Time For Goal Achievement: 05/16/18 Potential to Achieve Goals: Good    Frequency    Min 2X/week      PT Plan Discharge plan  needs to be updated    Co-evaluation              AM-PAC PT "6 Clicks" Mobility   Outcome Measure  Help needed turning from your back to your side while in a flat bed without using bedrails?: None Help needed moving from lying on your back to sitting on the side of a flat bed without using bedrails?: None Help needed moving to and from a bed to a chair (including a wheelchair)?: A Little Help needed standing up from a chair using your arms (e.g., wheelchair or bedside chair)?: A Little Help needed to walk in hospital room?: A Little Help needed climbing 3-5 steps with a railing? : Total 6 Click Score: 18    End of Session Equipment Utilized During Treatment: Gait belt Activity Tolerance: Patient limited by fatigue Patient left:  in chair;with call bell/phone within reach;with chair alarm set Nurse Communication: Mobility status;Precautions PT Visit Diagnosis: Other abnormalities of gait and mobility (R26.89);Muscle weakness (generalized) (M62.81);History of falling (Z91.81)     Time: [id number] PT Time Calculation (min) (ACUTE ONLY): 23 min  Charges:  $Therapeutic Activity: 23-37 mins                    Hendricks Limesmily Tavi Gaughran, PT 05/03/18, 12:27 PM [phone]

## 2018-05-04 LAB — URINE CULTURE: Culture: >=100000 — AB

## 2018-05-04 MED ORDER — CEPHALEXIN 500 MG PO CAPS
500.0000 mg | ORAL_CAPSULE | Freq: Two times a day (BID) | ORAL | Status: AC
  Administered 2018-05-04 – 2018-05-05 (×4): 500 mg via ORAL
  Filled 2018-05-04 (×4): qty 1

## 2018-05-04 NOTE — Clinical Social Work Note (Signed)
Patient is still in the appeals process. York Spaniel MSW,LCSW [phone]

## 2018-05-04 NOTE — Progress Notes (Signed)
[facility] [place] [facility]   PATIENT NAME: Julie Gaines    MR#:  [id number]  DATE OF BIRTH:  1945-11-13  SUBJECTIVE:   Patient's POA has appealed discharge  REVIEW OF SYSTEMS:    Patient with dementia pleasantly confused   Tolerating Diet: yes      DRUG ALLERGIES:   Allergies  Allergen Reactions  . Zoloft [Sertraline Hcl] Other (See Comments)    Seizure    VITALS:  Blood pressure (!) 152/98, pulse 75, temperature 100 F (37.8 C), temperature source Oral, resp. rate 20, height 5\' 6"  (1.676 m), weight 72.6 kg, SpO2 96 %.  PHYSICAL EXAMINATION:  Constitutional: Appears well-developed and well-nourished. No distress. HENT: Normocephalic. Marland Kitchen Oropharynx is clear and moist.  Eyes: Conjunctivae and EOM are normal. PERRLA, no scleral icterus.  Neck: Normal ROM. Neck supple. No JVD. No tracheal deviation. CVS: RRR, S1/S2 +, no murmurs, no gallops, no carotid bruit.  Pulmonary: Effort and breath sounds normal, no stridor, rhonchi, wheezes, rales.  Abdominal: Soft. BS +,  no distension, tenderness, rebound or guarding.  Musculoskeletal: Normal range of motion. No edema and no tenderness.  Neuro: Alert. CN 2-12 grossly intact. No focal deficits. Skin: Skin is warm and dry. No rash noted. Psychiatric: Patient with dementia     LABORATORY PANEL:   CBC Recent Labs  Lab 05/02/18 0350  WBC 14.0*  HGB 11.6*  HCT 34.9*  PLT 131*   ------------------------------------------------------------------------------------------------------------------  Chemistries  Recent Labs  Lab 05/02/18 0350  NA 136  K 3.6  CL 104  CO2 27  GLUCOSE 111*  BUN 25*  CREATININE 1.15*  CALCIUM 7.9*   ------------------------------------------------------------------------------------------------------------------  Cardiac Enzymes No results for input(s): TROPONINI in the last 168  hours. ------------------------------------------------------------------------------------------------------------------  RADIOLOGY:  Dg Chest 1 View  Result Date: 05/03/2018 CLINICAL DATA:  Fever. EXAM: CHEST  1 VIEW COMPARISON:  None. FINDINGS: The heart shadow appears mildly enlarged but size is poorly evaluated due to portable technique. There appears to be a tortuous thoracic aorta. The hila and mediastinum are otherwise unremarkable. No pneumothorax. No nodules or masses. No focal infiltrate to suggest pneumonia. IMPRESSION: No cause for fever identified. Electronically Signed   By: Gerome Sam III M.D   On: 05/03/2018 08:36   Mr Brain Wo Contrast  Result Date: 05/02/2018 CLINICAL DATA:  Stroke follow-up EXAM: MRI HEAD WITHOUT CONTRAST TECHNIQUE: Multiplanar, multiecho pulse sequences of the brain and surrounding structures were obtained without intravenous contrast. COMPARISON:  CT head 05/01/2018 FINDINGS: Brain: Negative for acute infarct. Mild atrophy. Mild ventricular enlargement is unchanged from prior studies. Extensive hyperintensity throughout the cerebral white matter bilaterally. Mild hyperintensity basal ganglia and pons bilaterally. Negative for hemorrhage or mass. No midline shift. Vascular: Normal arterial flow voids Skull and upper cervical spine: Negative Sinuses/Orbits: Mild mucosal edema paranasal sinuses. Left mastoid effusion. Normal orbit Other: None IMPRESSION: Atrophy and extensive chronic microvascular ischemia. No acute infarct. Electronically Signed   By: Marlan Palau M.D.   On: 05/02/2018 14:31     ASSESSMENT AND PLAN:   72 year old female with a history of vascular dementia and hypothyroidism who presented to the ER due to urinary frequency and unsteady gait.  1.  Acute metabolic encephalopathy in the setting of urinary tract infection with underlying dementia. Patient is at baseline.  MRI negative for acute stroke.   2.  E Coli UTI: Change to Keflex  and treat for 1 more day.   3.  Dementia: Patient appears to be at  baseline  4.  Hypothyroidism: Continue Synthroid  5.  Essential hypertension: Continue lisinopril and metoprolol  POA has appealed discharge to assisted living with home health   CODE STATUS: DNR  TOTAL TIME TAKING CARE OF THIS PATIENT: 24 minutes.     POSSIBLE D/C  After insurance reviews,  Cecil Bixby M.D on 05/04/2018 at 10:18 AM  Between 7am to 6pm - Pager - [phone] After 6pm go to [email or web address] - password [email or web address]  [facility]  Office  [phone]  CC: Primary care physician; Bernerd Limbo  Note: This dictation was prepared with Dragon dictation along with smaller phrase technology. Any transcriptional errors that result from this process are unintentional.

## 2018-05-05 NOTE — Progress Notes (Signed)
Physical Therapy Treatment Patient Details Name: Julie Gaines MRN: [id number] DOB: 1946-03-22 Today's Date: 05/05/2018    History of Present Illness Pt is a 73 y.o. female presenting to hospital 05/01/18 after sliding off of toilet; pt with burning/pain with urination and increased urinary frequency; pt also noted with increased confusion from baseline.  Pt admitted with acute encephalopathy d/t UTI.  PMH includes anxiety, CAD, cognitive dysfunction d/t vascular dementia, and htn.    PT Comments    Pt in bed, ready for session.  To edge of bed with supervision.  Sitting she is able to sit without assist and supervision for safety.  She stood with min assist to walker and was initially unsteady but when distracted talking about her former pets/jobs she was able to SLR and march in place with min guard.  She was fatigued with activity and sat quickly on bed.  After short rest, she was encouraged to sit in recliner at bedside.  Stood to walker with min assist for safety/balance and quickly began with large body ant/post body movements and head looking up at ceiling.  She was assisted to sitting and after a moment she stated "I lost track of reality for a moment".  She was able to lateral scoot up in bed without assist and return to supine.  BP was taken 142/81 P 57.   Discussed with MD and RN.  RN stated while unsteady, he was able to walk her to the bathroom this am.  Pt continues to demonstrate limited and unsafe mobility while in therapy sessions.  At this time, she is demonstrating unsafe mobility which would be challenging for ALF to safely manage at this time.  Will update discharge recommendations to reflect SNF level.  Unsure at this time if performance is limited by medical or behavioral tendencies.   If she returns to ALF level, a sit to stand or hoyer lift may be appropriate for pt and staff safety.     Follow Up Recommendations  SNF     Equipment Recommendations       Recommendations  for Other Services       Precautions / Restrictions Precautions Precautions: Fall Restrictions Weight Bearing Restrictions: No    Mobility  Bed Mobility Overal bed mobility: Independent             General bed mobility comments: Supine to sit without any noted difficulties; bed flat  Transfers Overall transfer level: Needs assistance   Transfers: Sit to/from Stand Sit to Stand: Min assist         General transfer comment: stood with min assist - generally unsteady and unsafe to attempt without assist.  Ambulation/Gait             General Gait Details: deferred due to unsteadiness and sway/shakiness   Stairs             Wheelchair Mobility    Modified Rankin (Stroke Patients Only)       Balance Overall balance assessment: Needs assistance Sitting-balance support: No upper extremity supported Sitting balance-Leahy Scale: Good     Standing balance support: Bilateral upper extremity supported Standing balance-Leahy Scale: Poor                              Cognition Arousal/Alertness: Awake/alert Behavior During Therapy: WFL for tasks assessed/performed Overall Cognitive Status: No family/caregiver present to determine baseline cognitive functioning  Exercises Other Exercises Other Exercises: standing with walker for marching in place and SLR x 10 bilaterally.    General Comments        Pertinent Vitals/Pain Pain Assessment: No/denies pain    Home Living                      Prior Function            PT Goals (current goals can now be found in the care plan section) Progress towards PT goals: Not progressing toward goals - comment    Frequency    Min 2X/week      PT Plan Discharge plan needs to be updated    Co-evaluation              AM-PAC PT "6 Clicks" Mobility   Outcome Measure  Help needed turning from your back to your side while  in a flat bed without using bedrails?: None Help needed moving from lying on your back to sitting on the side of a flat bed without using bedrails?: None Help needed moving to and from a bed to a chair (including a wheelchair)?: A Lot Help needed standing up from a chair using your arms (e.g., wheelchair or bedside chair)?: A Lot Help needed to walk in hospital room?: A Lot Help needed climbing 3-5 steps with a railing? : Total 6 Click Score: 15    End of Session Equipment Utilized During Treatment: Gait belt Activity Tolerance: Patient limited by fatigue;Other (comment) Patient left: in bed;with call bell/phone within reach;with bed alarm set Nurse Communication: Mobility status;Other (comment)       Time: 7564-3329 PT Time Calculation (min) (ACUTE ONLY): 19 min  Charges:  $Therapeutic Activity: 8-22 mins                     Danielle Dess, PTA 05/05/18, 10:02 AM

## 2018-05-05 NOTE — Progress Notes (Signed)
[facility] - [place] at [facility]   PATIENT NAME: Julie Gaines    MR#:  [id number]  DATE OF BIRTH:  06/05/1945  SUBJECTIVE:   No acute events overnight  REVIEW OF SYSTEMS:    Patient with dementia pleasantly confused   Tolerating Diet: yes      DRUG ALLERGIES:   Allergies  Allergen Reactions  . Zoloft [Sertraline Hcl] Other (See Comments)    Seizure    VITALS:  Blood pressure (!) 147/94, pulse 71, temperature 99.4 F (37.4 C), temperature source Oral, resp. rate 16, height 5\' 6"  (1.676 m), weight 72.6 kg, SpO2 93 %.  PHYSICAL EXAMINATION:  Constitutional: Appears well-developed and well-nourished. No distress. HENT: Normocephalic.  Oropharynx is clear and moist.  Eyes: Conjunctivae and EOM are normal. PERRLA, no scleral icterus.  Neck: Normal ROM. Neck supple. No JVD. No tracheal deviation. CVS: RRR, S1/S2 +, no murmurs, no gallops, no carotid bruit.  Pulmonary: Effort and breath sounds normal, no stridor, rhonchi, wheezes, rales.  Abdominal: Soft. BS +,  no distension, tenderness, rebound or guarding.  Musculoskeletal: Normal range of motion. No edema and no tenderness.  Neuro: Alert. CN 2-12 grossly intact. No focal deficits. Skin: Skin is warm and dry. No rash noted. Psychiatric: Patient with dementia     LABORATORY PANEL:   CBC Recent Labs  Lab 05/02/18 0350  WBC 14.0*  HGB 11.6*  HCT 34.9*  PLT 131*   ------------------------------------------------------------------------------------------------------------------  Chemistries  Recent Labs  Lab 05/02/18 0350  NA 136  K 3.6  CL 104  CO2 27  GLUCOSE 111*  BUN 25*  CREATININE 1.15*  CALCIUM 7.9*   ------------------------------------------------------------------------------------------------------------------  Cardiac Enzymes No results for input(s): TROPONINI in the last 168  hours. ------------------------------------------------------------------------------------------------------------------  RADIOLOGY:  No results found.   ASSESSMENT AND PLAN:   73 year old female with a history of vascular dementia and hypothyroidism who presented to the ER due to urinary frequency and unsteady gait.  1.  Acute metabolic encephalopathy in the setting of urinary tract infection with underlying dementia. Patient is at baseline.  MRI negative for acute stroke.   2.  E Coli UTI: Today will be the last day for antibiotics.  3.  Dementia: Patient appears to be at baseline  4.  Hypothyroidism: Continue Synthroid  5.  Essential hypertension: Continue lisinopril and metoprolol  POA has appealed discharge to assisted living   CODE STATUS: DNR  TOTAL TIME TAKING CARE OF THIS PATIENT: 21 minutes.     POSSIBLE D/C  After insurance reviews,  Ori Kreiter M.D on 05/05/2018 at 10:22 AM  Between 7am to 6pm - Pager - [phone] After 6pm go to [email or web address] - password [email or web address]  [facility]  Office  [phone]  CC: Primary care physician; Bernerd Limbo  Note: This dictation was prepared with Dragon dictation along with smaller phrase technology. Any transcriptional errors that result from this process are unintentional.

## 2018-05-06 MED ORDER — CEPHALEXIN 500 MG PO CAPS
500.0000 mg | ORAL_CAPSULE | Freq: Two times a day (BID) | ORAL | Status: AC
  Administered 2018-05-06: 500 mg via ORAL
  Filled 2018-05-06: qty 1

## 2018-05-06 NOTE — Progress Notes (Signed)
[facility] -  at [facility]   PATIENT NAME: Julie Gaines    MR#:  [id number]  DATE OF BIRTH:  12/18/1945  SUBJECTIVE:   We are awaiting insurance information as patient's power of attorney has appealed discharge No acute events reported overnight  REVIEW OF SYSTEMS:    Patient with dementia pleasantly confused   Tolerating Diet: yes      DRUG ALLERGIES:   Allergies  Allergen Reactions  . Zoloft [Sertraline Hcl] Other (See Comments)    Seizure    VITALS:  Blood pressure (!) 145/83, pulse 66, temperature 98.6 F (37 C), temperature source Oral, resp. rate 20, height 5\' 6"  (1.676 m), weight 72.6 kg, SpO2 97 %.  PHYSICAL EXAMINATION:  Constitutional: Appears well-developed and well-nourished. No distress. HENT: Normocephalic.  Oropharynx is clear and moist.  Eyes: Conjunctivae and EOM are normal. PERRLA, no scleral icterus.  Neck: Normal ROM. Neck supple. No JVD. No tracheal deviation. CVS: RRR, S1/S2 +, no murmurs, no gallops, no carotid bruit.  Pulmonary: Effort and breath sounds normal, no stridor, rhonchi, wheezes, rales.  Abdominal: Soft. BS +,  no distension, tenderness, rebound or guarding.  Musculoskeletal: Normal range of motion. No edema and no tenderness.  Neuro: Alert. CN 2-12 grossly intact. No focal deficits. Skin: Skin is warm and dry. No rash noted. Psychiatric: Patient with dementia     LABORATORY PANEL:   CBC Recent Labs  Lab 05/02/18 0350  WBC 14.0*  HGB 11.6*  HCT 34.9*  PLT 131*   ------------------------------------------------------------------------------------------------------------------  Chemistries  Recent Labs  Lab 05/02/18 0350  NA 136  K 3.6  CL 104  CO2 27  GLUCOSE 111*  BUN 25*  CREATININE 1.15*  CALCIUM 7.9*   ------------------------------------------------------------------------------------------------------------------  Cardiac Enzymes No results for input(s): TROPONINI in the  last 168 hours. ------------------------------------------------------------------------------------------------------------------  RADIOLOGY:  No results found.   ASSESSMENT AND PLAN:   73 year old female with a history of vascular dementia and hypothyroidism who presented to the ER due to urinary frequency and unsteady gait.  1.  Acute metabolic encephalopathy in the setting of urinary tract infection with underlying dementia. Patient is at baseline.  MRI negative for acute stroke.   2.  E Coli UTI: Today will be the last day for antibiotics.  3.  Dementia: Patient appears to be at baseline  4.  Hypothyroidism: Continue Synthroid  5.  Essential hypertension: Continue lisinopril and metoprolol  POA has appealed discharge to assisted living   Physical therapist has told me that when she ambulated patient when patient is distracted she has no signs of wobbliness  however when she is not distracted patient reports that she feels wobbly.    CODE STATUS: DNR  TOTAL TIME TAKING CARE OF THIS PATIENT: 18 minutes.     POSSIBLE D/C  After insurance reviews,  Jymir Dunaj M.D on 05/06/2018 at 10:36 AM  Between 7am to 6pm - Pager - [phone] After 6pm go to [email or web address] - password [email or web address]  [facility] [place] [facility]  Office  [phone]  CC: Primary care physician; Bernerd Limbo  Note: This dictation was prepared with Dragon dictation along with smaller phrase technology. Any transcriptional errors that result from this process are unintentional.

## 2018-05-07 ENCOUNTER — Inpatient Hospital Stay: Payer: Medicare Other

## 2018-05-07 NOTE — Progress Notes (Signed)
Physical Therapy Treatment Patient Details Name: Julie Gaines MRN: [id number] DOB: [birth year] Today's Date: 05/07/2018    History of Present Illness Pt is a 73 y.o. female presenting to hospital 05/01/18 after sliding off of toilet; pt with burning/pain with urination and increased urinary frequency; pt also noted with increased confusion from baseline.  Pt admitted with acute encephalopathy d/t UTI.  PMH includes anxiety, CAD, cognitive dysfunction d/t vascular dementia, and htn.    PT Comments    Pt agreeable to PT (somewhat reluctant initially) due to pain in L foot and chronic fibromyalgia pain; pt states she cannot exercise. Pt agreeable after brief explanation of activity level. Pt able to demonstrate I bed mobility, Mod I STS transfers with rolling walker and Min guard (for safety, did not actually require) for ambulation of 40 ft. Of note, pt states she cannot walk on L foot due to pain and also tells therapist she is not walking on L foot when in fact pt was walking in reciprocal pattern with mild decreased stance time on LLE. Pt participates in minimal BLE exercises in chair and encourage to do so throughout the day. 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 Continue PT to demonstrate consistent and progressed strength and endurance to allow for safe return home and functional mobility.    Follow Up Recommendations  Home health PT     Equipment Recommendations       Recommendations for Other Services       Precautions / Restrictions Precautions Precautions: Fall Restrictions Weight Bearing Restrictions: No    Mobility  Bed Mobility Overal bed mobility: Independent             General bed mobility comments: No difficulties; good  timing  Transfers Overall transfer level: Modified independent   Transfers: Sit to/from Stand Sit to Stand: Modified independent (Device/Increase time);Supervision         General transfer comment: close by supervision for safety; pt demonstrates good hand placement and stands/sits without difficulty and good control. Also demonstrates equal weight bearing BLEs  Ambulation/Gait Ambulation/Gait assistance: Min guard(for safety; did not require) Gait Distance (Feet): 45 Feet Assistive device: Rolling walker (2 wheeled) Gait Pattern/deviations: Decreased stance time - left     General Gait Details: Pt states she cannot walk on L foot and while walking in reciprocal pattern with mild antalgia to the L, pt says "see I am not walking on that foot".    Stairs             Wheelchair Mobility    Modified Rankin (Stroke Patients Only)       Balance Overall balance assessment: Needs assistance Sitting-balance support: Feet supported;Bilateral upper extremity supported Sitting balance-Leahy Scale: Normal     Standing balance support: Bilateral upper extremity supported Standing balance-Leahy Scale: Fair                              Cognition Arousal/Alertness: Awake/alert Behavior During Therapy: WFL for tasks assessed/performed Overall Cognitive Status: No family/caregiver present to determine baseline cognitive functioning  General Comments: Pt has a very "I can't do perception, but able to demonstrate exercise as well as ambulation without overt pain symtoms or distress/effort.       Exercises General Exercises - Lower Extremity Quad Sets: Strengthening;Both;10 reps Gluteal Sets: Strengthening;Both;10 reps Long Arc Quad: AROM;Both;10 reps;Seated Hip Flexion/Marching: AROM;Both;10 reps;Seated Toe Raises: AROM;Both;10 reps;Seated Heel Raises: AROM;Both;10 reps;Seated    General Comments         Pertinent Vitals/Pain Pain Assessment: Faces Faces Pain Scale: Hurts a little bit Pain Location: L foot Pain Descriptors / Indicators: (reports too painful to walk on, but demonstrates ability) Pain Intervention(s): Monitored during session    Home Living                      Prior Function            PT Goals (current goals can now be found in the care plan section) Progress towards PT goals: Progressing toward goals    Frequency    Min 2X/week      PT Plan Discharge plan needs to be updated    Co-evaluation              AM-PAC PT "6 Clicks" Mobility   Outcome Measure  Help needed turning from your back to your side while in a flat bed without using bedrails?: None Help needed moving from lying on your back to sitting on the side of a flat bed without using bedrails?: None Help needed moving to and from a bed to a chair (including a wheelchair)?: None Help needed standing up from a chair using your arms (e.g., wheelchair or bedside chair)?: A Little Help needed to walk in hospital room?: A Little Help needed climbing 3-5 steps with a railing? : A Little 6 Click Score: 21    End of Session Equipment Utilized During Treatment: Gait belt Activity Tolerance: Patient tolerated treatment well Patient left: in chair;with call bell/phone within reach;with chair alarm set Nurse Communication: Other (comment)(SW regarding session) PT Visit Diagnosis: Other abnormalities of gait and mobility (R26.89);Muscle weakness (generalized) (M62.81);History of falling (Z91.81)     Time: 6568-1275 PT Time Calculation (min) (ACUTE ONLY): 23 min  Charges:  $Gait Training: 8-22 mins $Therapeutic Exercise: 8-22 mins                      Scot Dock, PTA 05/07/2018, 4:36 PM

## 2018-05-07 NOTE — Progress Notes (Signed)
Pt is leaving with POA. Pt is being transferred via wheelchair with NT.

## 2018-05-07 NOTE — Progress Notes (Addendum)
Pt c/o left foot pain and states, "I cannot walk on it or put pressure on it." Pt was watched by RN and NT walking with walker and putting pressure on left foot. MD aware. Order for xray is in. No swelling noted.

## 2018-05-07 NOTE — Care Management Note (Addendum)
Case Management Note  Patient Details  Name: Julie Gaines MRN: [id number] Date of Birth: 1945-11-29    [facility] notified by CMA that it was determined that patient did not win the appeal and the POA was actually notified on 05/05/18, and should have discharged by noon on 05/06/18.  CSW confirmed with Julie Gaines at [place] that patient could return today.   RNCM called to follow up with POA Julie Gaines.  Julie Gaines CSW was present as witness and for discharge disposition.  POA confirms that she received a call from [employer] on saturday notifying her of the appeal.  POA also confirms she was aware that the patient should have discharged by noon and states "we don't have the money to pay that any way, so you guys will have to eat that cost".  RNCM notified POA that [facility] could accept the patient back today, and that home health services would be arranged if they are in agreement.  POA states "I have questions about her spinal fluid and if there are any changes from a year ago.  Until I get that information I won't be making my decision if she is leaving".  Bedside RN paged MD  POA notified that should the patient stay another night, each night from after noon on 05/06/18 would be be approximately $2,662.18 (total bill divided by length of stay) should insurance not approve.   MD spoke with POA directly and answered all of her questions.  POA in agreement to pick up the patient tonight and transfer her to [facility].  POA states "I will be there in about an hour"  POA agreeable to home health services. CMS Medicare.gov Compare Post Acute Care list reviewed with POA Julie Gaines.  She states she does not have a preference of home health agency and would like to use who [facility] prefers.  Referral made to [facility] with [facility].   Subjective/Objective:                    Action/Plan:   Expected Discharge Date:  05/03/18               Expected Discharge Plan:  Assisted Living / Rest  Home  In-House Referral:     Discharge planning Services  CM Consult  Post Acute Care Choice:  Home Health Choice offered to:  [facility] POA / Guardian  DME Arranged:    DME Agency:     HH Arranged:  RN, PT, Nurse's Aide, Speech Therapy, Social Work [employer] Agency:  [facility]  Status of Service:  Completed, signed off  If discussed at [employer], dates discussed:    Additional Comments:  Julie Fitch, RN 05/07/2018, 5:13 PM

## 2018-05-07 NOTE — Progress Notes (Signed)
Pt was able to stand at sink in room brushing her teeth without assistance for approximately 10 mins. Pt demonstrated no signs of instability.

## 2018-05-07 NOTE — Progress Notes (Signed)
[facility] - [place] at [facility]   PATIENT NAME: Julie Gaines    MR#:  [id number]  DATE OF BIRTH:  04-04-1946  SUBJECTIVE:   Pt. Complaining of Left foot pain.  X-rays of the left foot are negative for pathology, patient says she cannot bear any weight and walk using the left foot.  Patient has appealed her discharge 2 days ago and we are still awaiting to hear back about the appeal.  REVIEW OF SYSTEMS:    Patient with dementia pleasantly confused  Tolerating Diet: yes   DRUG ALLERGIES:   Allergies  Allergen Reactions  . Zoloft [Sertraline Hcl] Other (See Comments)    Seizure    VITALS:  Blood pressure (!) 165/99, pulse 65, temperature 98.2 F (36.8 C), temperature source Oral, resp. rate 16, height 5\' 6"  (1.676 m), weight 72.6 kg, SpO2 98 %.  PHYSICAL EXAMINATION:   Constitutional: Appears well-developed and well-nourished. No distress. HENT: Normocephalic.  Oropharynx is clear and moist.  Eyes: Conjunctivae and EOM are normal. PERRLA, no scleral icterus.  Neck: Normal ROM. Neck supple. No JVD. No tracheal deviation. CVS: RRR, S1/S2 +, no murmurs, no gallops, no carotid bruit.  Pulmonary: Effort and breath sounds normal, no stridor, rhonchi, wheezes, rales.  Abdominal: Soft. BS +,  no distension, tenderness, rebound or guarding.  Musculoskeletal: Normal range of motion. No edema and no tenderness.  Neuro: Alert. CN 2-12 grossly intact. No focal deficits. Skin: Skin is warm and dry. No rash noted. Psychiatric: Patient with dementia  LABORATORY PANEL:   CBC Recent Labs  Lab 05/02/18 0350  WBC 14.0*  HGB 11.6*  HCT 34.9*  PLT 131*   ------------------------------------------------------------------------------------------------------------------  Chemistries  Recent Labs  Lab 05/02/18 0350  NA 136  K 3.6  CL 104  CO2 27  GLUCOSE 111*  BUN 25*  CREATININE 1.15*  CALCIUM 7.9*    ------------------------------------------------------------------------------------------------------------------  Cardiac Enzymes No results for input(s): TROPONINI in the last 168 hours. ------------------------------------------------------------------------------------------------------------------  RADIOLOGY:  Dg Foot 2 Views Left  Result Date: 05/07/2018 CLINICAL DATA:  Acute left foot pain without known injury. EXAM: LEFT FOOT - 2 VIEW COMPARISON:  None. FINDINGS: There is no evidence of fracture or dislocation. Mild degenerative joint disease of the first metatarsophalangeal joint is noted. Minimal posterior calcaneal spurring is noted. Soft tissues are unremarkable. 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 No acute abnormality seen in the left foot. Electronically Signed   By: Lupita Raider, M.D.   On: 05/07/2018 10:53     ASSESSMENT AND PLAN:   73 year old female with a history of vascular dementia and hypothyroidism who presented to the ER due to urinary frequency and unsteady gait.  1.  Acute metabolic encephalopathy in the setting of urinary tract infection with underlying dementia. Patient is at baseline.  MRI negative for acute stroke.  2.  E Coli UTI: finished treatment for UTI w/ IV abx.   3.  Dementia: Patient appears to be at baseline  4.  Hypothyroidism: Continue Synthroid  5.  Essential hypertension: Continue lisinopril and metoprolol - BP stable.   6. Left foot pain - X-ray (-) this a.m. likely related to osteoarthritis.  Continue supportive care with pain control with Tylenol/Motrin.  POA has appealed discharge to assisted living and awaiting results of the appeal.     CODE STATUS: DNR  TOTAL TIME TAKING CARE OF THIS PATIENT: 25 minutes.     POSSIBLE D/C  After insurance reviews,  Houston Siren  M.D on 05/07/2018 at 1:16 PM  Between 7am to 6pm - Pager - [phone]  After 6pm go to [email or web address] -  password [email or web address]  [facility] [place] [facility]  Office  [phone]  CC: Primary care physician; Bernerd Limbo  Note: This dictation was prepared with Dragon dictation along with smaller phrase technology. Any transcriptional errors that result from this process are unintentional.

## 2018-05-07 NOTE — Clinical Social Work Note (Signed)
Per RNCM, [employer] denied patient's appeal. CSW spoke with patient's POA, Beatrix Shipper [phone] with RNCM and explained that appeal was denied and that patient is medically ready for discharge. After much discussion, POA agreed with discharge back to [facility] today. CSW notified Liborio Nixon at [facility] of discharge today. Liborio Nixon states that patient can return today. Patient's POA will transport patient back to facility. CSW prepared discharge packet for facility and left on chart. CSW signing off. Please re consult if further needs arise.   Ruthe Mannan MSW, [address] [phone]

## 2018-05-07 NOTE — Progress Notes (Signed)
Discharge information given to POA, Beatrix Shipper. IV removed. Pt dressed.

## 2019-05-24 IMAGING — CT CT HEAD W/O CM
4 series · 16 of 47 positions shown, 18 images · non-contrast
Comparison: 01/19/2018

CLINICAL DATA: Recent fall

EXAM:
CT HEAD WITHOUT CONTRAST
TECHNIQUE: Contiguous axial images were obtained from the base of the skull
through the vertex without intravenous contrast.

[Series 2: head wo · axial · 0.47mm/px · z∈[+73,+183]mm · 7 of 30 slices shown, 9 images]
[im 4/30  brain]
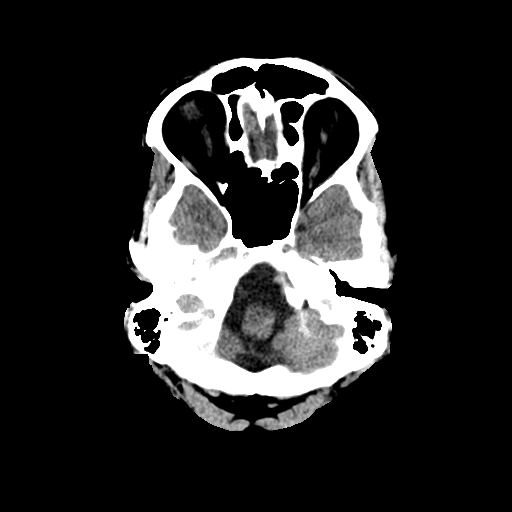
[im 4/30  bone]
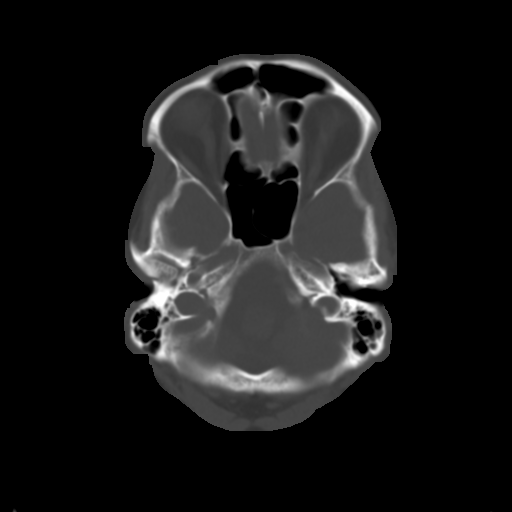
[im 8/30  brain]
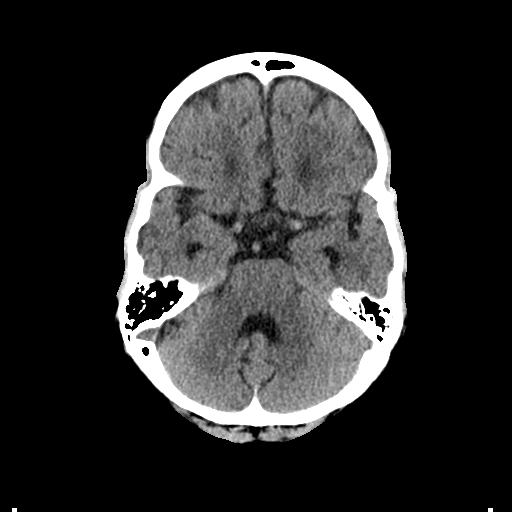
[im 11/30  brain]
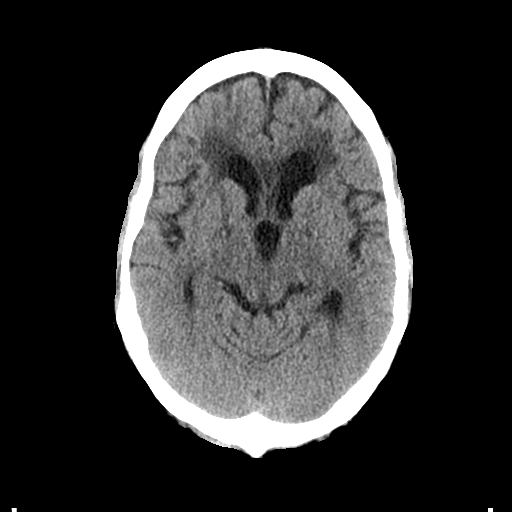
[im 15/30  brain]
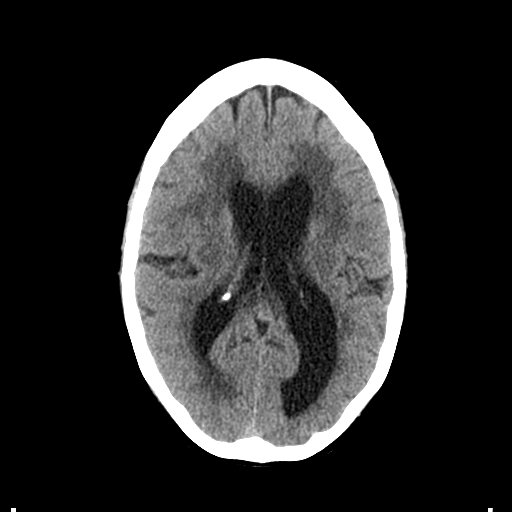
[im 19/30  brain]
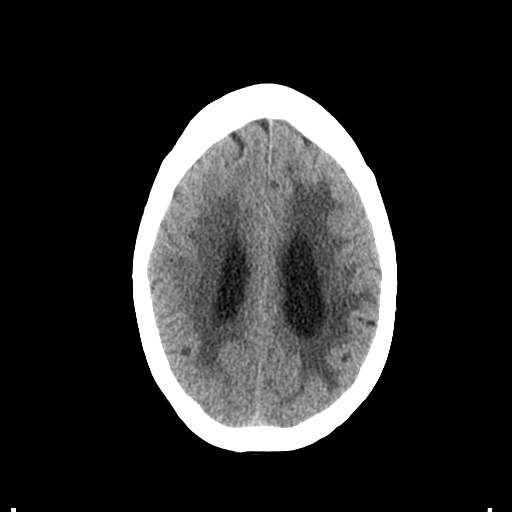
[im 19/30  bone]
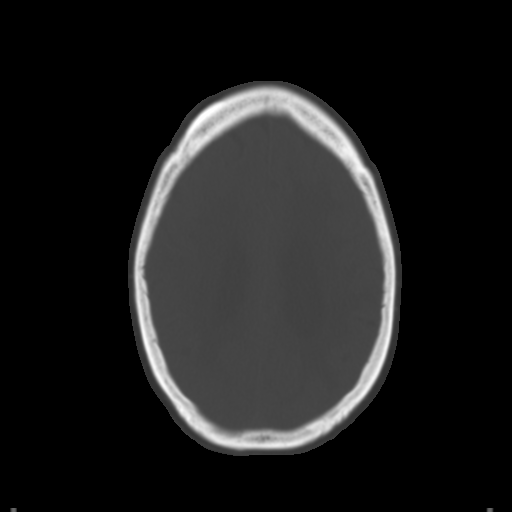
[im 22/30  brain]
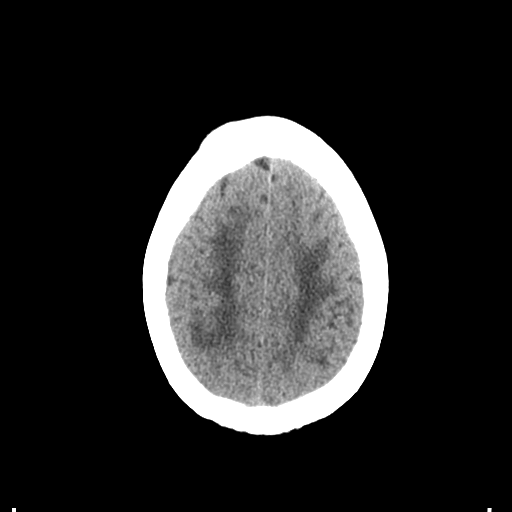
[im 26/30  brain]
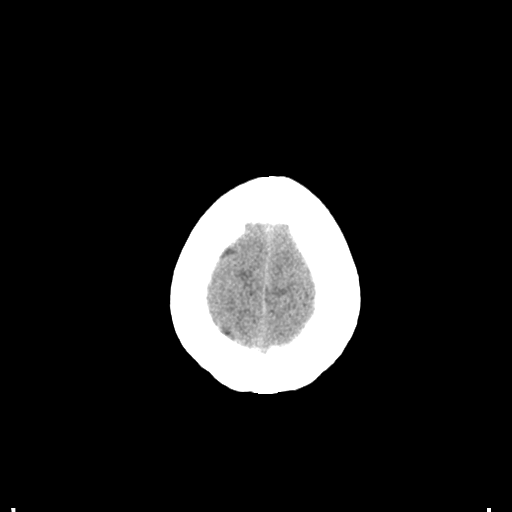

[Series 3: head bone · axial · 0.47mm/px · z∈[+72,+102]mm · 3 of 75 slices shown]
[im 8/75  bone]
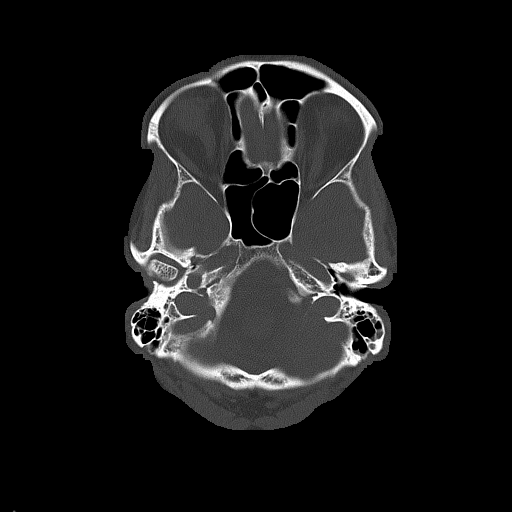
[im 15/75  bone]
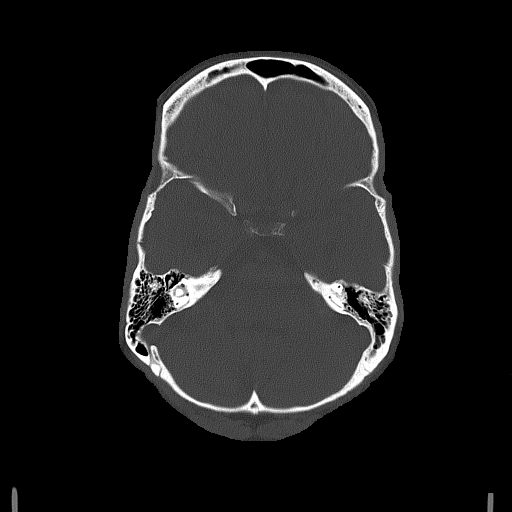
[im 23/75  bone]
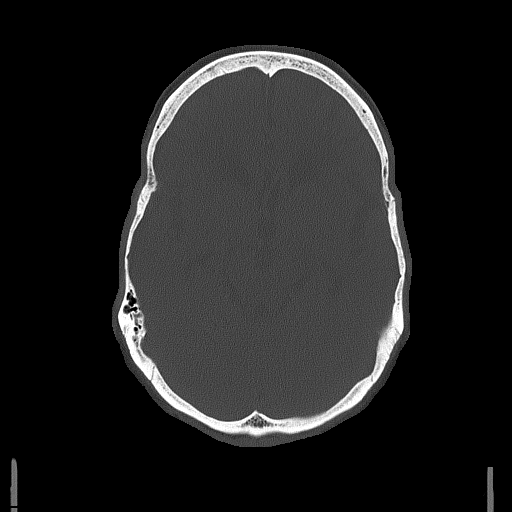

[Series 4: coronal soft tissue · coronal · 0.29mm/px · 3 of 64 slices shown]
[im 22/64  brain]
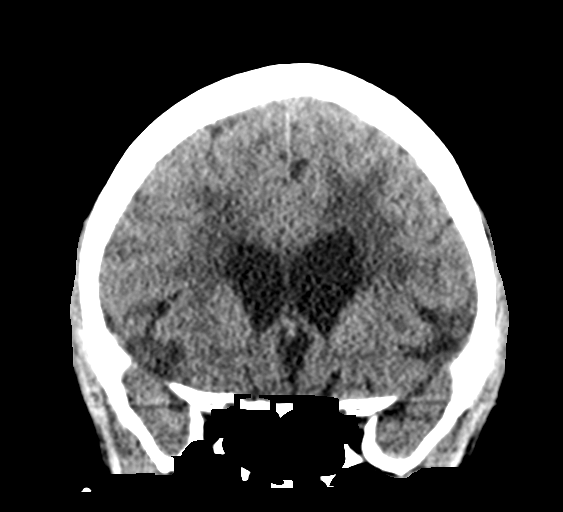
[im 29/64  brain]
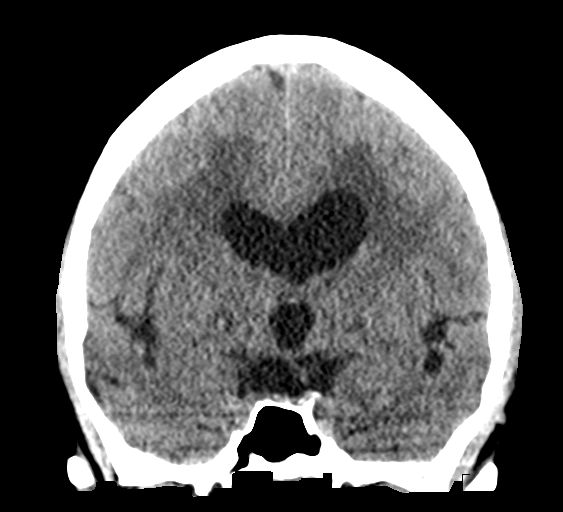
[im 36/64  brain]
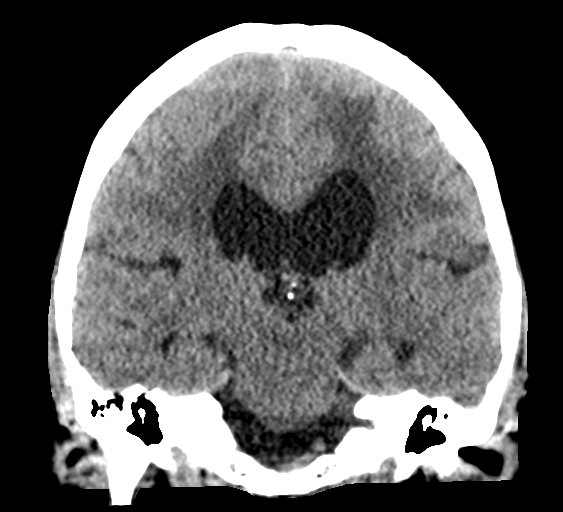

[Series 5: sagittal soft tissue · sagittal · 0.29mm/px · 3 of 50 slices shown]
[im 17/50  brain]
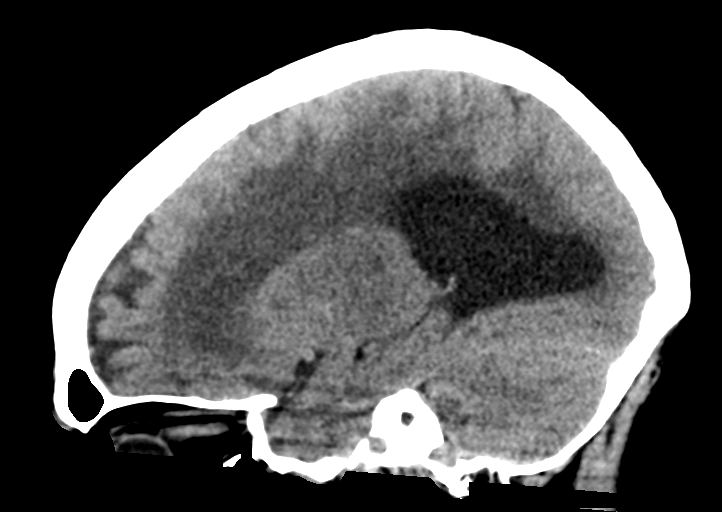
[im 25/50  brain]
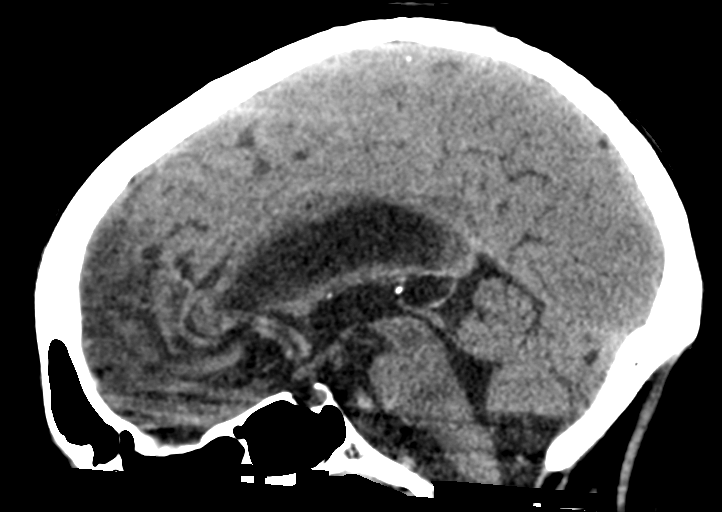
[im 33/50  brain]
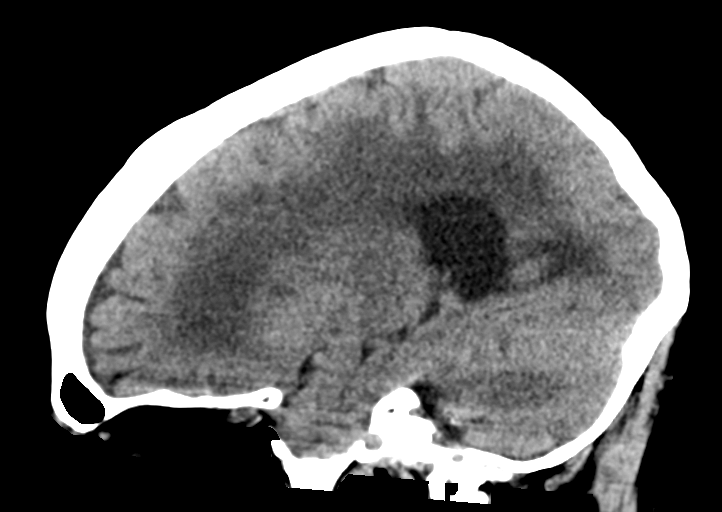

[16 of 47 positions shown; findings below may reference images not displayed]

FINDINGS: Brain: Mild atrophy is identified and stable. The ventricles are
again prominent similar to that seen on the prior exam. Diffuse
decreased attenuation is noted in the deep white matter bilaterally
stable from the prior exam. This may represent chronic white matter
ischemic change or possible transependymal absorption of CSF.
Correlate for any history of normal pressure hydrocephalus. No focal
hemorrhage or acute infarct is seen. No mass lesion is noted.

Vascular: No hyperdense vessel or unexpected calcification.

Skull: Normal. Negative for fracture or focal lesion.

Sinuses/Orbits: No acute finding.

Other: None.
IMPRESSION: Stable chronic changes as described above similar to that seen on
the prior exam. No acute abnormality noted.

## 2019-05-30 IMAGING — DX DG FOOT 2V*L*
2 series · 2 of 2 positions shown · non-contrast
Comparison: None.

CLINICAL DATA: Acute left foot pain without known injury.

EXAM:
LEFT FOOT - 2 VIEW

[foot ap]
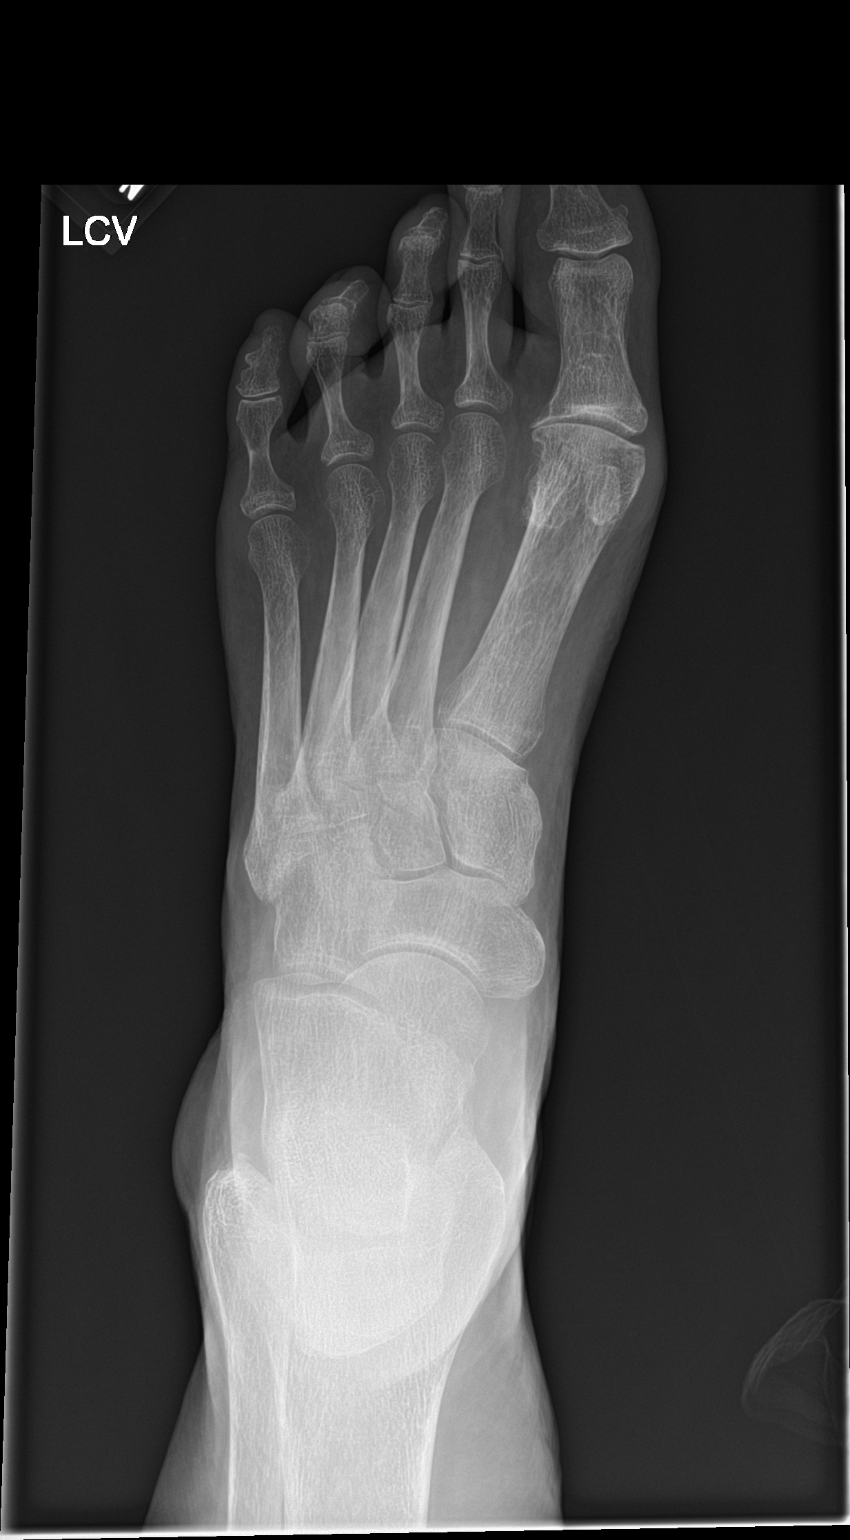

[foot lat]
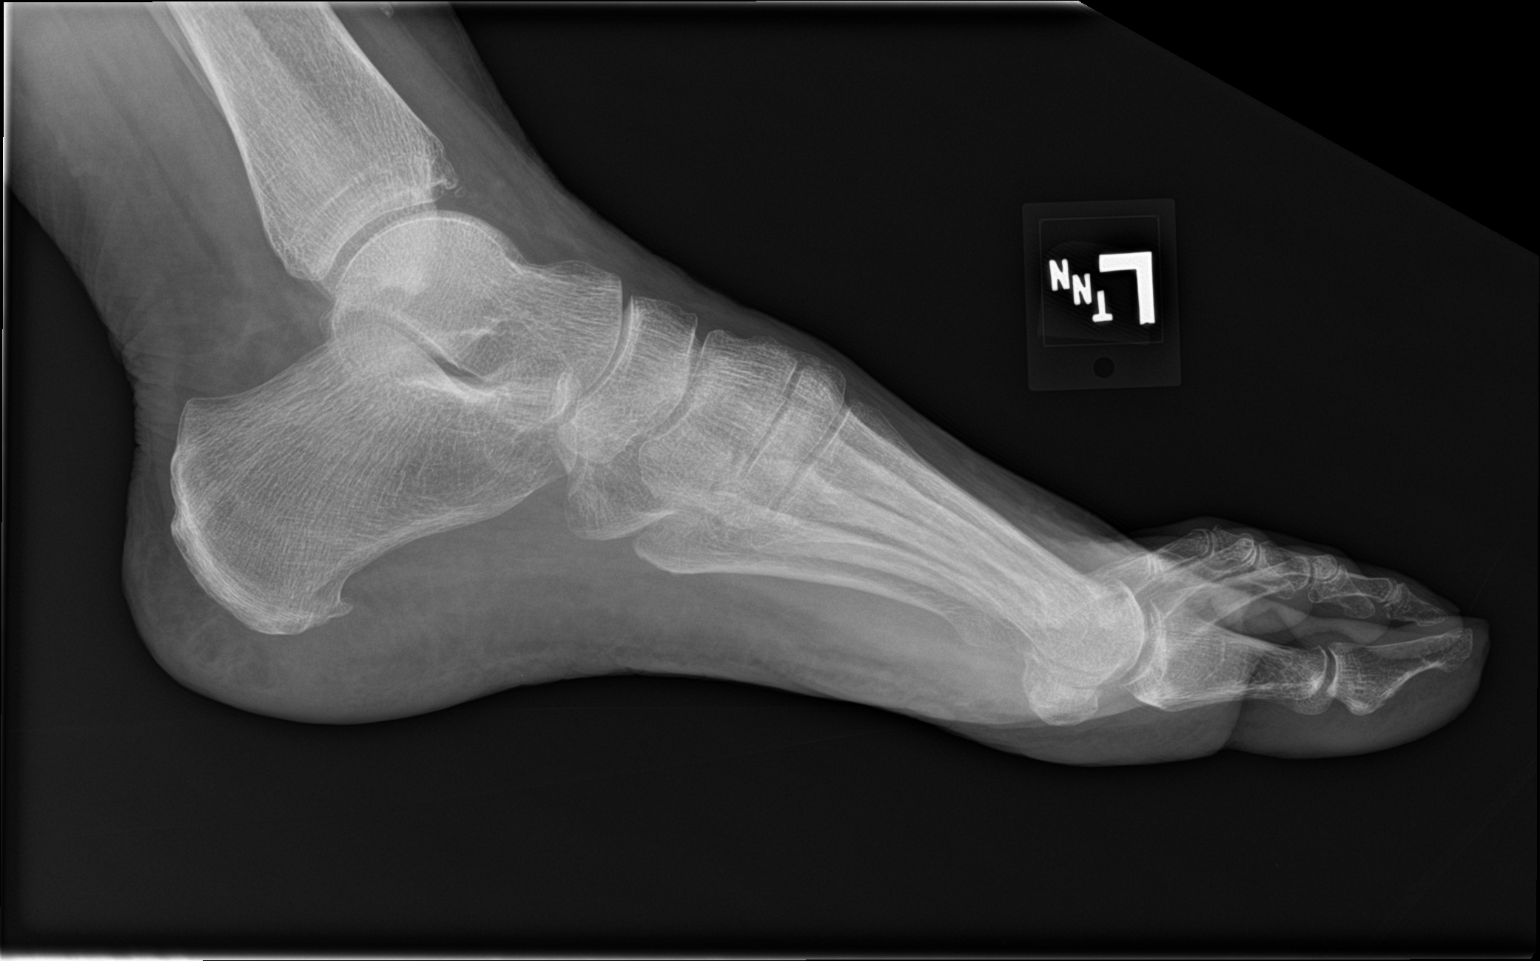

[2 of 2 positions shown; findings below may reference images not displayed]

FINDINGS: There is no evidence of fracture or dislocation. Mild degenerative
joint disease of the first metatarsophalangeal joint is noted.
Minimal posterior calcaneal spurring is noted. Soft tissues are
unremarkable.
IMPRESSION: Mild degenerative joint disease of the first metatarsophalangeal
joint. No acute abnormality seen in the left foot.
